# Patient Record
Sex: Female | Born: 1965
Health system: Southern US, Community
[De-identification: ages and names within clinical notes are randomized; demographics above are authoritative.]

## PROBLEM LIST (undated history)

## (undated) DIAGNOSIS — K449 Diaphragmatic hernia without obstruction or gangrene: Secondary | ICD-10-CM

## (undated) HISTORY — PX: TONSILLECTOMY: SUR1361

## (undated) HISTORY — DX: Diaphragmatic hernia without obstruction or gangrene: K44.9

## (undated) HISTORY — PX: LUMBAR DISC SURGERY: SHX700

## (undated) HISTORY — PX: REFRACTIVE SURGERY: SHX103

## (undated) HISTORY — PX: TUBAL LIGATION: SHX77

---

## 2002-09-22 ENCOUNTER — Ambulatory Visit (HOSPITAL_COMMUNITY): Admission: RE | Admit: 2002-09-22 | Discharge: 2002-09-22 | Payer: Self-pay | Admitting: Internal Medicine

## 2002-09-22 ENCOUNTER — Encounter: Payer: Self-pay | Admitting: Internal Medicine

## 2003-03-11 ENCOUNTER — Ambulatory Visit (HOSPITAL_COMMUNITY): Admission: RE | Admit: 2003-03-11 | Discharge: 2003-03-11 | Payer: Self-pay | Admitting: Internal Medicine

## 2004-08-01 ENCOUNTER — Ambulatory Visit (HOSPITAL_COMMUNITY): Admission: RE | Admit: 2004-08-01 | Discharge: 2004-08-01 | Payer: Self-pay | Admitting: Obstetrics and Gynecology

## 2004-08-07 ENCOUNTER — Other Ambulatory Visit: Admission: RE | Admit: 2004-08-07 | Discharge: 2004-08-07 | Payer: Self-pay | Admitting: Obstetrics and Gynecology

## 2004-08-21 ENCOUNTER — Inpatient Hospital Stay (HOSPITAL_COMMUNITY): Admission: RE | Admit: 2004-08-21 | Discharge: 2004-08-23 | Payer: Self-pay | Admitting: Obstetrics and Gynecology

## 2004-08-21 ENCOUNTER — Encounter: Payer: Self-pay | Admitting: Obstetrics and Gynecology

## 2004-11-04 ENCOUNTER — Emergency Department (HOSPITAL_COMMUNITY): Admission: EM | Admit: 2004-11-04 | Discharge: 2004-11-04 | Payer: Self-pay | Admitting: Emergency Medicine

## 2006-03-13 ENCOUNTER — Ambulatory Visit (HOSPITAL_COMMUNITY): Admission: RE | Admit: 2006-03-13 | Discharge: 2006-03-13 | Payer: Self-pay | Admitting: Family Medicine

## 2006-12-12 ENCOUNTER — Ambulatory Visit (HOSPITAL_COMMUNITY): Admission: RE | Admit: 2006-12-12 | Discharge: 2006-12-12 | Payer: Self-pay | Admitting: Internal Medicine

## 2009-04-06 ENCOUNTER — Ambulatory Visit (HOSPITAL_COMMUNITY): Admission: RE | Admit: 2009-04-06 | Discharge: 2009-04-06 | Payer: Self-pay | Admitting: Internal Medicine

## 2009-04-14 ENCOUNTER — Ambulatory Visit (HOSPITAL_COMMUNITY): Admission: RE | Admit: 2009-04-14 | Discharge: 2009-04-14 | Payer: Self-pay | Admitting: Internal Medicine

## 2009-05-03 ENCOUNTER — Encounter: Admission: RE | Admit: 2009-05-03 | Discharge: 2009-05-03 | Payer: Self-pay | Admitting: Endocrinology

## 2009-05-03 ENCOUNTER — Other Ambulatory Visit: Admission: RE | Admit: 2009-05-03 | Discharge: 2009-05-03 | Payer: Self-pay | Admitting: Diagnostic Radiology

## 2009-05-19 ENCOUNTER — Emergency Department (HOSPITAL_COMMUNITY): Admission: EM | Admit: 2009-05-19 | Discharge: 2009-05-19 | Payer: Self-pay | Admitting: Emergency Medicine

## 2009-05-20 ENCOUNTER — Encounter (INDEPENDENT_AMBULATORY_CARE_PROVIDER_SITE_OTHER): Payer: Self-pay | Admitting: Neurosurgery

## 2009-05-20 ENCOUNTER — Ambulatory Visit: Payer: Self-pay | Admitting: Vascular Surgery

## 2009-05-20 ENCOUNTER — Ambulatory Visit: Admission: RE | Admit: 2009-05-20 | Discharge: 2009-05-20 | Payer: Self-pay | Admitting: Neurosurgery

## 2010-02-12 HISTORY — PX: ABDOMINAL HYSTERECTOMY: SHX81

## 2010-03-04 ENCOUNTER — Encounter: Payer: Self-pay | Admitting: Endocrinology

## 2010-05-03 LAB — DIFFERENTIAL
Basophils Absolute: 0.1 10*3/uL (ref 0.0–0.1)
Basophils Relative: 1 % (ref 0–1)
Metamyelocytes Relative: 0 %
Myelocytes: 0 %
Neutro Abs: 2.3 10*3/uL (ref 1.7–7.7)
Neutrophils Relative %: 36 % — ABNORMAL LOW (ref 43–77)
Promyelocytes Absolute: 0 %

## 2010-05-03 LAB — BASIC METABOLIC PANEL
BUN: 10 mg/dL (ref 6–23)
CO2: 29 mEq/L (ref 19–32)
Chloride: 106 mEq/L (ref 96–112)
Creatinine, Ser: 0.81 mg/dL (ref 0.4–1.2)
GFR calc Af Amer: >60 mL/min (ref >60)
Potassium: 3.6 mEq/L (ref 3.5–5.1)

## 2010-05-03 LAB — D-DIMER, QUANTITATIVE: D-Dimer, Quant: 0.92 ug/mL-FEU — ABNORMAL HIGH (ref 0.00–0.48)

## 2010-05-03 LAB — POCT CARDIAC MARKERS
CKMB, poc: <1 ng/mL — ABNORMAL LOW (ref 1.0–8.0)
Myoglobin, poc: 66.8 ng/mL (ref 12–200)

## 2010-05-03 LAB — CBC
HCT: 39.5 % (ref 36.0–46.0)
MCHC: 35.1 g/dL (ref 30.0–36.0)
MCV: 88.4 fL (ref 78.0–100.0)
RBC: 4.47 MIL/uL (ref 3.87–5.11)

## 2010-06-21 ENCOUNTER — Other Ambulatory Visit: Payer: Self-pay | Admitting: Endocrinology

## 2010-06-21 DIAGNOSIS — E049 Nontoxic goiter, unspecified: Secondary | ICD-10-CM

## 2010-06-27 ENCOUNTER — Other Ambulatory Visit: Payer: Self-pay

## 2010-06-28 ENCOUNTER — Ambulatory Visit
Admission: RE | Admit: 2010-06-28 | Discharge: 2010-06-28 | Disposition: A | Discharge status: Home / Self Care | Payer: Commercial Indemnity | Source: Ambulatory Visit | Attending: Endocrinology | Admitting: Endocrinology

## 2010-06-28 DIAGNOSIS — E049 Nontoxic goiter, unspecified: Secondary | ICD-10-CM

## 2010-06-30 NOTE — Discharge Summary (Signed)
NAME:  Rachael Goodman, Rachael Goodman               ACCOUNT NO.:  [id number]   MEDICAL RECORD NO.:  [id number]          PATIENT TYPE:  INP   LOCATION:  A420                          FACILITY:  [facility]   PHYSICIAN:  Tilda Burrow, M.D. DATE OF BIRTH:  12-25-1965   DATE OF ADMISSION:  08/21/2004  DATE OF DISCHARGE:  LH                                 DISCHARGE SUMMARY   ADMISSION DIAGNOSIS:  Menometrorrhagia (heavy and prolonged periods).   DISCHARGE DIAGNOSES:  Menometrorrhagia (heavy and prolonged periods).   PROCEDURE:  Total abdominal hysterectomy with panniculectomy on August 21, 2004.   DISCHARGE MEDICATIONS:  1.  Tylox 1 q.4h. p.r.n. pain, dispense 30.  2.  Benadryl 50 mg 1 p.o. q.6h. p.r.n. itching.  3.  Doxycycline 100 mg twice daily for 7 days.   FOLLOW UP:  In five days for staple removal and JP drain removal.   HOSPITAL SUMMARY:  This 45 year old was admitted for hysterectomy as  described in the HPI. Panniculectomy was performed as well. The patient had  postoperative hemoglobin 12.4, hematocrit 36 compared to admitting diagnosis  of hemoglobin 12.7 and hematocrit 36.4. She did excellently with minimal  drainage per JP drains, approximately 30 cc per day. She will be followed  up. Followup will be in five days for JP removal and staple reduction.       JVF/MEDQ  D:  08/23/2004  T:  08/23/2004  Job:  [id number]

## 2010-06-30 NOTE — Op Note (Signed)
NAME:  Rachael Goodman, Rachael Goodman               ACCOUNT NO.:  [id number]   MEDICAL RECORD NO.:  [id number]          PATIENT TYPE:  AMB   LOCATION:  DAY                           FACILITY:  [facility]   PHYSICIAN:  Tilda Burrow, M.D. DATE OF BIRTH:  Aug 23, 1965   DATE OF PROCEDURE:  08/21/2004  DATE OF DISCHARGE:                                 OPERATIVE REPORT   PREOPERATIVE DIAGNOSIS:  Menometrorrhagia, multiple small uterine fibroids.   POSTOPERATIVE DIAGNOSIS:  Menometrorrhagia (heavy and prolonged bleeding).   OPERATION PERFORMED:  Total abdominal hysterectomy, panniculectomy.   SURGEON:  Tilda Burrow, M.D.   ASSISTANTMarlinda Mike, RN.   ANESTHESIA:  General.   COMPLICATIONS:  None.   FINDINGS:  Normal external surface to upper limits of normal sized uterus,  small thinned out area of surface of ascending colon oversewn with 2-0 silk.  Normal-appearing ovaries, no evidence of endometriosis.  Evidence of prior  Falope ring application sterilization.   DESCRIPTION OF PROCEDURE:  The patient was taken to the operating room and  prepped and draped for lower abdominal surgery with Foley catheter in place  and vaginal prepping having been performed as well as abdominal prepping.  The patient had been marked in the preop area with panniculectomy outlined,  60 cm in transverse length by approximately 10 to 14 cm in AP height.  The  patient then had sharp marking of the skin along the previously delineated  skin lines and Bovie cautery used to excise a 1.5 to 2 cm thick x 60 cm long  x 10 cm wide ellipse of skin and underlying fatty tissues, with careful  attention to point cautery of the blood vessels encountered.  The procedure  was completed with nice layer of fatty tissue left over the underlying  fascia.  The inferior and superior edges to the incision were undermined a  couple of centimeters on either side which allowed increased mobility and  ease of tissue reapproximation.  We then  closed the two sides to the  anterior superior iliac crest, with a series of 2-0 plain sutures on either  side pulling the tissue together and then a series of interrupted horizontal  mattress sutures just beneath the skin, which resulted in good tissue edge  approximation.  Staples were closed from the anterior superior iliac crest  to the lateral aspect of the incision.   The middle portion of the incision was then opened in the method of  Pfannenstiel with midline entry into the peritoneal cavity without  difficulty and identification of normal-appearing bowel, well evacuated by  preop bowel prep.  The bowel was packed away using three laparotomy tapes  that were moistened and then Balfour retractor was positioned.  The uterus  was grasped with a Lahey thyroid tenaculum, elevated, round ligaments cut,  tied and ligated bilaterally using Kelly clamps, scissor transection and 0  chromic ligature.  The utero-ovarian ligaments were isolated on either side,  clamped, cut and suture ligated.  Bladder flap was developed anteriorly.  The uterine vessels were skeletonized, cross-clamped with curved Heaney  clamp with Kelly clamp placed  above the area  of the intended transection  and then the uterine vessels were transected.  The upper and lower cardinal  ligaments were serially clamped, cut and suture ligated using straight  Heaney clamps, knife dissection and 0 chromic suture ligature. The bladder  flap was down, had been down nicely and an anterior stab incision in the  anterior cervical vaginal fornix was performed and the really large 4 cm  wide cervix excised off of the vaginal cuff.  Four Aldridge stitches were  placed for lateral support.  Aldridge stitches were placed at each lateral  vaginal angle for good hemostasis.  A figure-of-eight suture was placed in  the posterior vaginal apex to reduce the diameter of the posterior vaginal  cuff.  We then closed the cuff side-to-side with a  series of interrupted 0  plain sutures without any difficulty.  Hemostasis was satisfactory.  We did  require point cautery a couple of times along the anterior vaginal wall.  We  were careful to stay away from the bladder sufficiently to feel confident  that no potential bladder injury occurred.  The procedure was then  continued by inspection for hemostasis, reapproximation of the bladder flap,  inspection of the pedicles _of the utero-ovarian ligament and confirming  hemostasis as needed.  Laparotomy equipment was removed.  Anterior  peritoneum closed with 2-0 chromic.  The fascia trimmed in the midline  approximately 1.5 cm which allowed pulling the fascia together with improved  tone and then we reapproximated the subcu fatty tissue with 2-0 plain,  pulled the skin edges together as well and used staples to complete tissue  edge reapproximation.  Flap Jackson-Pratt drains had been placed in the  subcutaneous fatty space prior to placement of the skin closure.  These were  allowed to exit through separate stab incisions.  Sponge and needle counts  were correct.  The patient was then transferred to the recovery room in good  condition.       JVF/MEDQ  D:  08/21/2004  T:  08/21/2004  Job:  [id number]

## 2010-06-30 NOTE — H&P (Signed)
NAME:  Rachael Goodman, Rachael Goodman               ACCOUNT NO.:  [id number]   MEDICAL RECORD NO.:  [id number]          PATIENT TYPE:  AMB   LOCATION:  DAY                           FACILITY:  [facility]   PHYSICIAN:  Tilda Burrow, M.D. DATE OF BIRTH:  1965-11-08   DATE OF ADMISSION:  DATE OF DISCHARGE:  LH                                HISTORY & PHYSICAL   ADMISSION DIAGNOSIS:  Menometrorrhagia, heavy and prolonged periods.   HISTORY OF PRESENT ILLNESS:  This 45 year old female was admitted for a  hysterectomy for heavy and prolonged periods, described as up to 17 days in  length, 3 days light, 7 days heavy, 7 days light, which were not resulting  in distinct anemia.  She had an endometrial biopsy which showed  dyssynchronous endometrium without inflammation or easily treated options.  Uterus is slightly enlarged, measuring 9 cm along the endometrial biopsy.  The uterus had  several uterine fibroids, all small in size.  There was a  suspected endometrial polyp in the lower uterine segment.  This was not  identified on biopsy effort.  The patient is a smoker and not interested in  oral contraceptive management and desires to proceed with a hysterectomy.  GC and chlamydia cultures are negative.  Pap smear is reported as normal.  Additionally the patient desires partial panniculectomy for cosmetic  reasons.  This has been sketched out with the patient, using paint markers,  with the patient allowed to evaluate this, and she desires to have this  performed as a portion of the procedure, and the patient is aware that this  is a noncovered service.   PAST MEDICAL HISTORY:  Benign.   PAST SURGICAL HISTORY:  Tonsillectomy in 1970, tubal ligation in 2002.   PHYSICAL EXAMINATION:  VITAL SIGNS:  Height 5 feet 6 inches, weight 160.  Blood pressure 116/76.  GENERAL:  Exam shows a healthy-appearing, energetic, active African-American  female who is alert and oriented times three.  Last menstrual period August 14, 2004.  HEENT:  Pupils are equal, round and reactive.  Extraocular movements are  intact.  NECK:  Supple.  CHEST:  Clear to auscultation.  ABDOMEN:  Actually quite good tone for the most part, though the patient  finds the lower abdominal wall skin laxity aggravating.  The patient has  been told that some swelling is part of the recovery process and that the  increased incision size increases the risk of infection or bleeding from the  incision itself.  The patient seems to have a realistic understanding of the  limitations of tummy tuck surgery, particularly in someone who is not  morbidly obese.   GYN HISTORY:  Notable for an ultrasound showing uterus to be 8.8 x 4.3 x 5.8  cm with GC and chlamydia cultures negative.  OB history notable for trisomy  41 with her last pregnancy and cystic hygroma noted on the infant.  Periods  are described as lasting greater than 3 weeks for several menses in a row.   PLAN:  Hysterectomy, preservation of ovaries, removal of cervix,  panniculectomy 08/21/2004 at 9  a.m.       JVF/MEDQ  D:  08/18/2004  T:  08/18/2004  Job:  [id number]   cc:   Tilda Burrow, M.D.  Fax: [phone]

## 2010-10-12 ENCOUNTER — Other Ambulatory Visit (HOSPITAL_COMMUNITY): Payer: Self-pay | Admitting: Family Medicine

## 2010-10-12 DIAGNOSIS — Z139 Encounter for screening, unspecified: Secondary | ICD-10-CM

## 2010-10-19 ENCOUNTER — Ambulatory Visit (HOSPITAL_COMMUNITY): Admission: RE | Admit: 2010-10-19 | Payer: Managed Care, Other (non HMO) | Source: Ambulatory Visit

## 2011-09-04 ENCOUNTER — Other Ambulatory Visit (HOSPITAL_COMMUNITY): Payer: Self-pay | Admitting: Internal Medicine

## 2011-09-04 DIAGNOSIS — R1314 Dysphagia, pharyngoesophageal phase: Secondary | ICD-10-CM

## 2011-09-04 DIAGNOSIS — Z0000 Encounter for general adult medical examination without abnormal findings: Secondary | ICD-10-CM

## 2011-09-11 ENCOUNTER — Ambulatory Visit (HOSPITAL_COMMUNITY)
Admission: RE | Admit: 2011-09-11 | Discharge: 2011-09-11 | Disposition: A | Discharge status: Home / Self Care | Payer: Managed Care, Other (non HMO) | Source: Ambulatory Visit | Attending: Internal Medicine | Admitting: Internal Medicine

## 2011-09-11 ENCOUNTER — Ambulatory Visit (HOSPITAL_COMMUNITY): Payer: Managed Care, Other (non HMO)

## 2011-09-11 DIAGNOSIS — K224 Dyskinesia of esophagus: Secondary | ICD-10-CM | POA: Insufficient documentation

## 2011-09-11 DIAGNOSIS — Z1231 Encounter for screening mammogram for malignant neoplasm of breast: Secondary | ICD-10-CM | POA: Insufficient documentation

## 2011-09-11 DIAGNOSIS — Z0000 Encounter for general adult medical examination without abnormal findings: Secondary | ICD-10-CM

## 2011-09-11 DIAGNOSIS — R1314 Dysphagia, pharyngoesophageal phase: Secondary | ICD-10-CM

## 2011-09-13 ENCOUNTER — Other Ambulatory Visit (HOSPITAL_COMMUNITY): Payer: Self-pay | Admitting: Internal Medicine

## 2011-09-13 DIAGNOSIS — R51 Headache: Secondary | ICD-10-CM

## 2011-09-17 ENCOUNTER — Ambulatory Visit (HOSPITAL_COMMUNITY)
Admission: RE | Admit: 2011-09-17 | Discharge: 2011-09-17 | Disposition: A | Discharge status: Home / Self Care | Payer: Managed Care, Other (non HMO) | Source: Ambulatory Visit | Attending: Internal Medicine | Admitting: Internal Medicine

## 2011-09-17 ENCOUNTER — Encounter (HOSPITAL_COMMUNITY): Payer: Self-pay

## 2011-09-17 DIAGNOSIS — R51 Headache: Secondary | ICD-10-CM | POA: Insufficient documentation

## 2011-09-20 ENCOUNTER — Encounter (INDEPENDENT_AMBULATORY_CARE_PROVIDER_SITE_OTHER): Payer: Self-pay | Admitting: *Deleted

## 2011-09-27 ENCOUNTER — Telehealth (INDEPENDENT_AMBULATORY_CARE_PROVIDER_SITE_OTHER): Payer: Self-pay | Admitting: *Deleted

## 2011-09-27 ENCOUNTER — Ambulatory Visit (INDEPENDENT_AMBULATORY_CARE_PROVIDER_SITE_OTHER): Payer: Managed Care, Other (non HMO) | Admitting: Internal Medicine

## 2011-09-27 ENCOUNTER — Encounter (INDEPENDENT_AMBULATORY_CARE_PROVIDER_SITE_OTHER): Payer: Self-pay | Admitting: Internal Medicine

## 2011-09-27 ENCOUNTER — Other Ambulatory Visit (INDEPENDENT_AMBULATORY_CARE_PROVIDER_SITE_OTHER): Payer: Self-pay | Admitting: *Deleted

## 2011-09-27 VITALS — BP 104/58 | HR 72 | Temp 98.6°F | Ht 66.0 in | Wt 171.8 lb

## 2011-09-27 DIAGNOSIS — R1310 Dysphagia, unspecified: Secondary | ICD-10-CM

## 2011-09-27 DIAGNOSIS — R198 Other specified symptoms and signs involving the digestive system and abdomen: Secondary | ICD-10-CM | POA: Insufficient documentation

## 2011-09-27 DIAGNOSIS — R1314 Dysphagia, pharyngoesophageal phase: Secondary | ICD-10-CM

## 2011-09-27 DIAGNOSIS — R194 Change in bowel habit: Secondary | ICD-10-CM

## 2011-09-27 DIAGNOSIS — Z1211 Encounter for screening for malignant neoplasm of colon: Secondary | ICD-10-CM

## 2011-09-27 DIAGNOSIS — K582 Mixed irritable bowel syndrome: Secondary | ICD-10-CM

## 2011-09-27 DIAGNOSIS — K589 Irritable bowel syndrome without diarrhea: Secondary | ICD-10-CM

## 2011-09-27 MED ORDER — OMEPRAZOLE 40 MG PO CPDR: 40.0000 mg | DELAYED_RELEASE_CAPSULE | Freq: Every day | ORAL | Status: DC

## 2011-09-27 NOTE — Telephone Encounter (Signed)
Patient needs movi prep 

## 2011-09-27 NOTE — Progress Notes (Signed)
Subjective:     Patient ID: Rachael Goodman, female   DOB: [birth year], 46 y.o.   MRN: [id number]  HPI Referred to our office by Dr. Sherwood Gambler. She tells me she is having problems swallowing. Symptoms for over a year. Beef burger and thicker foods will lodge.   She underwent a Barium Swallow in July: IMPRESSION:  1. Moderate esophageal dysmotility, nonspecific but most likely  age advanced presbyesophagus.  2. No stricture or anatomic cause for patient's symptoms.    She says it is hard for her to pass her stool even it is diarrhea or constipation.  Symptoms x 2 yrs.  She says no matter how much she strains stool will not come out.  No melena or bright red rectal bleeding. Stools are brown in color.  Stools are normal size but some are pencil thin.  With constipation, stools are ball like.    Review of Systems see hpi Current Outpatient Prescriptions  Medication Sig Dispense Refill  . amphetamine-dextroamphetamine (ADDERALL XR) 20 MG 24 hr capsule Take 20 mg by mouth every morning.       History reviewed. No pertinent past medical history. Past Surgical History  Procedure Date  . Abdominal hysterectomy    No Known Allergies History   Social History  . Marital Status: Married    Spouse Name: N/A    Number of Children: N/A  . Years of Education: N/A   Occupational History  . Not on file.   Social History Main Topics  . Smoking status: Current Everyday Smoker  . Smokeless tobacco: Not on file   Comment: 1 pack every two days  . Alcohol Use: Yes     glass of wine rarely  . Drug Use: No  . Sexually Active: Not on file   Other Topics Concern  . Not on file   Social History Narrative  . No narrative on file   Family Status  Relation Status Death Age  . Mother Alive     DM 2,  . Father Deceased     CHF  . Sister Alive   . Brother Alive     hypertension        Objective:   Physical Exam  Filed Vitals:   09/27/11 1019  Height: 5\' 6"  (1.676 m)  Weight: 171 lb 12.8  oz (77.928 kg)   Alert and oriented. Skin warm and dry. Oral mucosa is moist.   . Sclera anicteric, conjunctivae is pink. Thyroid not enlarged. No cervical lymphadenopathy. Lungs clear. Heart regular rate and rhythm.  Abdomen is soft. Bowel sounds are positive. No hepatomegaly. No abdominal masses felt. No tenderness.  No edema to lower extremities.        Assessment:   Dysphagia to solids. Change in stools. Constipation/Diarrhea.    Plan:    EGD/Ed/ Colonoscopy.  The risks and benefits such as perforation, bleeding, and infection were reviewed with the patient and is agreeable.  Samples of Linzess x 2 box given to patient for her to try with instructions.

## 2011-09-27 NOTE — Patient Instructions (Addendum)
EGD for dysphagia. Colonnoscopy for change in stools.The risks and benefits such as perforation, bleeding, and infection were reviewed with the patient and is agreeable. Rx for Omeprazole eprescribed to [facility].

## 2011-09-28 MED ORDER — PEG-KCL-NACL-NASULF-NA ASC-C 100 G PO SOLR: 1.0000 | Freq: Once | ORAL | Status: DC

## 2011-10-08 ENCOUNTER — Encounter (HOSPITAL_COMMUNITY): Payer: Self-pay | Admitting: Pharmacy Technician

## 2011-10-24 MED ORDER — SODIUM CHLORIDE 0.45 % IV SOLN
INTRAVENOUS | Status: DC
  Administered 2011-10-25: 12:00:00 via INTRAVENOUS

## 2011-10-25 ENCOUNTER — Ambulatory Visit (HOSPITAL_COMMUNITY)
Admission: RE | Admit: 2011-10-25 | Discharge: 2011-10-25 | Disposition: A | Discharge status: Home / Self Care | Payer: Managed Care, Other (non HMO) | Source: Ambulatory Visit | Attending: Internal Medicine | Admitting: Internal Medicine

## 2011-10-25 ENCOUNTER — Encounter (HOSPITAL_COMMUNITY): Payer: Self-pay | Admitting: *Deleted

## 2011-10-25 ENCOUNTER — Encounter (HOSPITAL_COMMUNITY)
Admission: RE | Disposition: A | Discharge status: Home / Self Care | Payer: Self-pay | Source: Ambulatory Visit | Attending: Internal Medicine

## 2011-10-25 DIAGNOSIS — K5730 Diverticulosis of large intestine without perforation or abscess without bleeding: Secondary | ICD-10-CM | POA: Insufficient documentation

## 2011-10-25 DIAGNOSIS — R194 Change in bowel habit: Secondary | ICD-10-CM

## 2011-10-25 DIAGNOSIS — K5900 Constipation, unspecified: Secondary | ICD-10-CM

## 2011-10-25 DIAGNOSIS — R1310 Dysphagia, unspecified: Secondary | ICD-10-CM

## 2011-10-25 DIAGNOSIS — K5909 Other constipation: Secondary | ICD-10-CM | POA: Insufficient documentation

## 2011-10-25 DIAGNOSIS — D126 Benign neoplasm of colon, unspecified: Secondary | ICD-10-CM | POA: Insufficient documentation

## 2011-10-25 DIAGNOSIS — K449 Diaphragmatic hernia without obstruction or gangrene: Secondary | ICD-10-CM

## 2011-10-25 DIAGNOSIS — K208 Other esophagitis: Secondary | ICD-10-CM

## 2011-10-25 HISTORY — PX: SAVORY DILATION: SHX5439

## 2011-10-25 HISTORY — PX: MALONEY DILATION: SHX5535

## 2011-10-25 HISTORY — PX: BALLOON DILATION: SHX5330

## 2011-10-25 SURGERY — COLONOSCOPY WITH ESOPHAGOGASTRODUODENOSCOPY (EGD)
Anesthesia: Moderate Sedation

## 2011-10-25 MED ORDER — MIDAZOLAM HCL 5 MG/5ML IJ SOLN
INTRAMUSCULAR | Status: DC | PRN
  Administered 2011-10-25 (×7): 2 mg via INTRAVENOUS

## 2011-10-25 MED ORDER — MEPERIDINE HCL 50 MG/ML IJ SOLN
INTRAMUSCULAR | Status: DC | PRN
  Administered 2011-10-25 (×2): 25 mg via INTRAVENOUS

## 2011-10-25 MED ORDER — MEPERIDINE HCL 50 MG/ML IJ SOLN
INTRAMUSCULAR | Status: AC
  Filled 2011-10-25: qty 1

## 2011-10-25 MED ORDER — MIDAZOLAM HCL 5 MG/5ML IJ SOLN
INTRAMUSCULAR | Status: AC
  Filled 2011-10-25: qty 10

## 2011-10-25 MED ORDER — STERILE WATER FOR IRRIGATION IR SOLN
Status: DC | PRN
  Administered 2011-10-25: 13:00:00

## 2011-10-25 MED ORDER — BUTAMBEN-TETRACAINE-BENZOCAINE 2-2-14 % EX AERO
INHALATION_SPRAY | CUTANEOUS | Status: DC | PRN
  Administered 2011-10-25: 2 via TOPICAL

## 2011-10-25 NOTE — OR Nursing (Signed)
14 mg of versed given during egd and colonoscopy procedure and 1 mg of versed wasted and witnessed by d houghton

## 2011-10-25 NOTE — Op Note (Signed)
EGD PROCEDURE REPORT  PATIENT:  Rachael Goodman  MR#:  [id number] Birthdate:  [birth year], 46 y.o., female Endoscopist:  Dr. Malissa Hippo, MD Referred By:  Dr. Madelin Rear. Sherwood Gambler, MD Procedure Date: 10/25/2011  Procedure:   EGD, ED & Colonoscopy.  Indications:  Patient is 46 year old African female who presents with recurrent solid food dysphagia. She's had this symptom for well over a year. She has chronic GERD. She is on omeprazole but does not take it every day. She also is noted constipation and undergoing colonoscopy as well.            Informed Consent:  The risks, benefits, alternatives & imponderables which include, but are not limited to, bleeding, infection, perforation, drug reaction and potential missed lesion have been reviewed.  The potential for biopsy, lesion removal, esophageal dilation, etc. have also been discussed.  Questions have been answered.  All parties agreeable.  Please see history & physical in medical record for more information.  Medications:  Demerol 50 mg IV Versed 14 mg IV Cetacaine spray topically for oropharyngeal anesthesia  EGD  Description of procedure:  The endoscope was introduced through the mouth and advanced to the second portion of the duodenum without difficulty or limitations. The mucosal surfaces were surveyed very carefully during advancement of the scope and upon withdrawal.  Findings:  Esophagus:  Mucosa of the esophagus was normal. Focal erythema noted at GE junction no ring or stricture was identified. GEJ:  36 cm Hiatus:  38 cm Stomach:  Stomach was empty and distended very well with insufflation. Folds in the proximal stomach were normal. At gastric body along the posterior wall there was circumfle right lesion with appearance suggestive of AV malformation.mucosae at antrum pyloric channel angularis fundus and cardia was normal.  Duodenum:  Normal bulbar mucosa. Cozaar of second part of the duodenum was normal except single  lymphangiectasia.  Therapeutic/Diagnostic Maneuvers Performed:  Esophagus dilated by passing 54 and 56 [place] Maloney dilator. Esophageal mucosa was reexamined post dilation and no mucosal disruption identified.  COLONOSCOPY Description of procedure:  After a digital rectal exam was performed, that colonoscope was advanced from the anus through the rectum and colon to the area of the cecum, ileocecal valve and appendiceal orifice. The cecum was deeply intubated. These structures were well-seen and photographed for the record. From the level of the cecum and ileocecal valve, the scope was slowly and cautiously withdrawn. The mucosal surfaces were carefully surveyed utilizing scope tip to flexion to facilitate fold flattening as needed. The scope was pulled down into the rectum where a thorough exam including retroflexion was performed.  Findings:   Prep excellent. Small diverticulum next to ileocecal valve. Two small polyps ablated via cold biopsy from the region of splenic flexure and submitted together. Normal rectal mucosa and anal rectal junction.  Therapeutic/Diagnostic Maneuvers Performed:  See above  Complications:  None  Cecal Withdrawal Time:  9 minutes  Impression:  Mild changes of reflux esophagitis limited to GE junction. Small sliding hiatal hernia. Single small AV malformation at gastric body. Esophagus dilated by passing 54 and 56 [place] Maloney dilators given history of dysphagia. Single small cecal diverticulum. Two small polyps ablated via cold biopsy from splenic flexure and submitted together.   Recommendations:  Antireflux measures reinforced. Patient advised to take omeprazole daily. High fiber diet plus fiber supplement 3-4 g daily. Colace 2 tablets by mouth daily. Office visit with Dr. Emelda Fear to check for rectocele. I will contact patient with results of biopsy  and further recommendations.  Marzell Allemand U  10/25/2011 2:40 PM  CC: Dr. Cassell Smiles.,  MD & Dr. Bonnetta Barry ref. provider found

## 2011-10-25 NOTE — H&P (Signed)
Rachael Goodman is an 46 y.o. female.   Chief Complaint: Patient is here for EGD, ED and colonoscopy. HPI: Patient is 46 year old African female who presents with several month history of solid food dysphagia. She also has difficulty with ice cream. She has sporadic heartburn. She believes PPIs helping. She also complains of constipation denies abdominal pain melena or rectal bleeding. At times she has to push in perineal region in order to have a bowel movement. Her last pelvic exam was 3 years ago. Patient had her esophagus stretched 7 years ago and it alleviated her dysphagia for several years. Him history is negative for colorectal carcinoma.  History reviewed. No pertinent past medical history.  Past Surgical History  Procedure Date  . Abdominal hysterectomy     History reviewed. No pertinent family history. Social History:  reports that she has been smoking.  She does not have any smokeless tobacco history on file. She reports that she drinks alcohol. She reports that she does not use illicit drugs.  Allergies: No Known Allergies  Medications Prior to Admission  Medication Sig Dispense Refill  . amphetamine-dextroamphetamine (ADDERALL XR) 20 MG 24 hr capsule Take 20 mg by mouth every morning.      . diazepam (VALIUM) 2 MG tablet Take 2 mg by mouth every 6 (six) hours as needed. Sleep      . hyoscyamine (LEVSIN SL) 0.125 MG SL tablet Place 0.125 mg under the tongue every 4 (four) hours as needed. Stomach Cramps      . omeprazole (PRILOSEC) 40 MG capsule Take 40 mg by mouth daily.       . peg 3350 powder (MOVIPREP) 100 G SOLR Take 1 kit (100 g total) by mouth once.  1 kit  0  . triamcinolone cream (KENALOG) 0.1 % Apply 1 application topically 2 (two) times daily as needed. Itching/Dry Skin        No results found for this or any previous visit (from the past 48 hour(s)). No results found.  ROS  Blood pressure 113/73, pulse 74, temperature 98 F (36.7 C), temperature source Oral,  resp. rate 20, height 5\' 6"  (1.676 m), weight 168 lb (76.204 kg), SpO2 98.00%. Physical Exam  Constitutional: She appears well-developed and well-nourished.  HENT:  Mouth/Throat: Oropharynx is clear and moist.  Eyes: Conjunctivae normal are normal. No scleral icterus.  Neck: No thyromegaly present.  Cardiovascular: Normal rate, regular rhythm and normal heart sounds.   No murmur heard. Respiratory: Effort normal and breath sounds normal.  GI: Soft. She exhibits no distension and no mass. There is no tenderness.  Musculoskeletal: She exhibits no edema.  Lymphadenopathy:    She has no cervical adenopathy.  Neurological: She is alert.  Skin: Skin is warm and dry.     Assessment/Plan Food dysphagia in a patient with chronic GERD. Change in bowel habits with constipation. She may also have rectocele. EGD, ED and colonoscopy.  Rachael Goodman U 10/25/2011, 1:33 PM

## 2011-10-29 ENCOUNTER — Encounter (HOSPITAL_COMMUNITY): Payer: Self-pay | Admitting: Internal Medicine

## 2011-11-06 ENCOUNTER — Encounter (INDEPENDENT_AMBULATORY_CARE_PROVIDER_SITE_OTHER): Payer: Self-pay | Admitting: *Deleted

## 2012-06-17 ENCOUNTER — Other Ambulatory Visit (HOSPITAL_COMMUNITY): Payer: Self-pay | Admitting: Internal Medicine

## 2012-06-17 ENCOUNTER — Ambulatory Visit (HOSPITAL_COMMUNITY)
Admission: RE | Admit: 2012-06-17 | Discharge: 2012-06-17 | Disposition: A | Discharge status: Home / Self Care | Payer: 59 | Source: Ambulatory Visit | Attending: Internal Medicine | Admitting: Internal Medicine

## 2012-06-17 DIAGNOSIS — S59909A Unspecified injury of unspecified elbow, initial encounter: Secondary | ICD-10-CM | POA: Insufficient documentation

## 2012-06-17 DIAGNOSIS — S6990XA Unspecified injury of unspecified wrist, hand and finger(s), initial encounter: Secondary | ICD-10-CM | POA: Insufficient documentation

## 2012-06-17 DIAGNOSIS — S63509A Unspecified sprain of unspecified wrist, initial encounter: Secondary | ICD-10-CM

## 2012-06-17 DIAGNOSIS — W19XXXA Unspecified fall, initial encounter: Secondary | ICD-10-CM | POA: Insufficient documentation

## 2012-06-17 DIAGNOSIS — IMO0002 Reserved for concepts with insufficient information to code with codable children: Secondary | ICD-10-CM

## 2012-06-17 DIAGNOSIS — M25529 Pain in unspecified elbow: Secondary | ICD-10-CM | POA: Insufficient documentation

## 2012-06-17 DIAGNOSIS — M25539 Pain in unspecified wrist: Secondary | ICD-10-CM | POA: Insufficient documentation

## 2013-05-04 ENCOUNTER — Other Ambulatory Visit (HOSPITAL_COMMUNITY): Payer: Self-pay | Admitting: Internal Medicine

## 2013-05-04 DIAGNOSIS — Z0000 Encounter for general adult medical examination without abnormal findings: Secondary | ICD-10-CM

## 2013-05-04 DIAGNOSIS — R1310 Dysphagia, unspecified: Secondary | ICD-10-CM

## 2013-05-04 DIAGNOSIS — E041 Nontoxic single thyroid nodule: Secondary | ICD-10-CM

## 2013-05-08 ENCOUNTER — Ambulatory Visit (HOSPITAL_COMMUNITY): Payer: 59

## 2013-05-08 ENCOUNTER — Other Ambulatory Visit (HOSPITAL_COMMUNITY): Payer: 59

## 2013-05-08 ENCOUNTER — Ambulatory Visit (HOSPITAL_COMMUNITY): Payer: 59 | Attending: Internal Medicine

## 2013-05-11 ENCOUNTER — Ambulatory Visit (HOSPITAL_COMMUNITY): Payer: 59

## 2013-05-11 ENCOUNTER — Other Ambulatory Visit (HOSPITAL_COMMUNITY): Payer: 59

## 2013-09-04 ENCOUNTER — Encounter (HOSPITAL_BASED_OUTPATIENT_CLINIC_OR_DEPARTMENT_OTHER): Payer: Self-pay | Admitting: *Deleted

## 2013-09-04 ENCOUNTER — Other Ambulatory Visit: Payer: Self-pay | Admitting: Orthopedic Surgery

## 2013-09-04 NOTE — Progress Notes (Signed)
No labs needed-nurse psych ed

## 2013-09-07 ENCOUNTER — Ambulatory Visit (HOSPITAL_BASED_OUTPATIENT_CLINIC_OR_DEPARTMENT_OTHER): Payer: 59 | Admitting: Anesthesiology

## 2013-09-07 ENCOUNTER — Ambulatory Visit (HOSPITAL_BASED_OUTPATIENT_CLINIC_OR_DEPARTMENT_OTHER)
Admission: RE | Admit: 2013-09-07 | Discharge: 2013-09-07 | Disposition: A | Discharge status: Home / Self Care | Payer: 59 | Source: Ambulatory Visit | Attending: Orthopedic Surgery | Admitting: Orthopedic Surgery

## 2013-09-07 ENCOUNTER — Encounter (HOSPITAL_BASED_OUTPATIENT_CLINIC_OR_DEPARTMENT_OTHER)
Admission: RE | Disposition: A | Discharge status: Home / Self Care | Payer: Self-pay | Source: Ambulatory Visit | Attending: Orthopedic Surgery

## 2013-09-07 ENCOUNTER — Encounter (HOSPITAL_BASED_OUTPATIENT_CLINIC_OR_DEPARTMENT_OTHER): Payer: Self-pay | Admitting: *Deleted

## 2013-09-07 ENCOUNTER — Encounter (HOSPITAL_BASED_OUTPATIENT_CLINIC_OR_DEPARTMENT_OTHER): Payer: 59 | Admitting: Anesthesiology

## 2013-09-07 DIAGNOSIS — F17200 Nicotine dependence, unspecified, uncomplicated: Secondary | ICD-10-CM | POA: Insufficient documentation

## 2013-09-07 DIAGNOSIS — S62609A Fracture of unspecified phalanx of unspecified finger, initial encounter for closed fracture: Secondary | ICD-10-CM

## 2013-09-07 DIAGNOSIS — X58XXXA Exposure to other specified factors, initial encounter: Secondary | ICD-10-CM | POA: Insufficient documentation

## 2013-09-07 DIAGNOSIS — IMO0002 Reserved for concepts with insufficient information to code with codable children: Secondary | ICD-10-CM | POA: Insufficient documentation

## 2013-09-07 HISTORY — PX: CLOSED REDUCTION FINGER WITH PERCUTANEOUS PINNING: SHX5612

## 2013-09-07 LAB — POCT HEMOGLOBIN-HEMACUE: Hemoglobin: 14.2 g/dL (ref 12.0–15.0)

## 2013-09-07 SURGERY — CLOSED REDUCTION, FINGER, WITH PERCUTANEOUS PINNING
Anesthesia: General | Laterality: Right

## 2013-09-07 MED ORDER — FENTANYL CITRATE 0.05 MG/ML IJ SOLN
INTRAMUSCULAR | Status: DC | PRN
  Administered 2013-09-07: 100 ug via INTRAVENOUS

## 2013-09-07 MED ORDER — OXYCODONE HCL 5 MG PO TABS: 5.0000 mg | ORAL_TABLET | Freq: Once | ORAL | Status: DC | PRN

## 2013-09-07 MED ORDER — HYDROMORPHONE HCL PF 1 MG/ML IJ SOLN: 0.2500 mg | INTRAMUSCULAR | Status: DC | PRN

## 2013-09-07 MED ORDER — PROPOFOL 10 MG/ML IV BOLUS
INTRAVENOUS | Status: DC | PRN
  Administered 2013-09-07: 180 mg via INTRAVENOUS

## 2013-09-07 MED ORDER — BUPIVACAINE HCL (PF) 0.25 % IJ SOLN
INTRAMUSCULAR | Status: AC
  Filled 2013-09-07: qty 30

## 2013-09-07 MED ORDER — FENTANYL CITRATE 0.05 MG/ML IJ SOLN
INTRAMUSCULAR | Status: AC
  Filled 2013-09-07: qty 6

## 2013-09-07 MED ORDER — FENTANYL CITRATE 0.05 MG/ML IJ SOLN: 50.0000 ug | INTRAMUSCULAR | Status: DC | PRN

## 2013-09-07 MED ORDER — OXYCODONE HCL 5 MG/5ML PO SOLN: 5.0000 mg | Freq: Once | ORAL | Status: DC | PRN

## 2013-09-07 MED ORDER — ONDANSETRON HCL 4 MG/2ML IJ SOLN
INTRAMUSCULAR | Status: DC | PRN
  Administered 2013-09-07: 4 mg via INTRAVENOUS

## 2013-09-07 MED ORDER — CHLORHEXIDINE GLUCONATE 4 % EX LIQD: 60.0000 mL | Freq: Once | CUTANEOUS | Status: DC

## 2013-09-07 MED ORDER — DEXAMETHASONE SODIUM PHOSPHATE 4 MG/ML IJ SOLN
INTRAMUSCULAR | Status: DC | PRN
  Administered 2013-09-07: 8 mg via INTRAVENOUS

## 2013-09-07 MED ORDER — CEFAZOLIN SODIUM-DEXTROSE 2-3 GM-% IV SOLR
INTRAVENOUS | Status: AC
  Filled 2013-09-07: qty 50

## 2013-09-07 MED ORDER — MIDAZOLAM HCL 2 MG/2ML IJ SOLN
INTRAMUSCULAR | Status: AC
  Filled 2013-09-07: qty 2

## 2013-09-07 MED ORDER — LACTATED RINGERS IV SOLN
INTRAVENOUS | Status: DC
  Administered 2013-09-07: 11:00:00 via INTRAVENOUS

## 2013-09-07 MED ORDER — LIDOCAINE HCL (CARDIAC) 20 MG/ML IV SOLN
INTRAVENOUS | Status: DC | PRN
  Administered 2013-09-07: 50 mg via INTRAVENOUS

## 2013-09-07 MED ORDER — BUPIVACAINE HCL (PF) 0.25 % IJ SOLN
INTRAMUSCULAR | Status: DC | PRN
  Administered 2013-09-07: 8 mL

## 2013-09-07 MED ORDER — MIDAZOLAM HCL 2 MG/2ML IJ SOLN: 1.0000 mg | INTRAMUSCULAR | Status: DC | PRN

## 2013-09-07 MED ORDER — CEFAZOLIN SODIUM-DEXTROSE 2-3 GM-% IV SOLR
2.0000 g | INTRAVENOUS | Status: AC
  Administered 2013-09-07: 2 g via INTRAVENOUS

## 2013-09-07 MED ORDER — MIDAZOLAM HCL 5 MG/5ML IJ SOLN
INTRAMUSCULAR | Status: DC | PRN
  Administered 2013-09-07: 2 mg via INTRAVENOUS

## 2013-09-07 MED ORDER — OXYCODONE-ACETAMINOPHEN 5-325 MG PO TABS: 1.0000 | ORAL_TABLET | ORAL | Status: DC | PRN

## 2013-09-07 SURGICAL SUPPLY — 64 items
APL SKNCLS STERI-STRIP NONHPOA (GAUZE/BANDAGES/DRESSINGS)
BANDAGE ELASTIC 3 VELCRO ST LF (GAUZE/BANDAGES/DRESSINGS) ×3 IMPLANT
BANDAGE ELASTIC 4 VELCRO ST LF (GAUZE/BANDAGES/DRESSINGS) IMPLANT
BENZOIN TINCTURE PRP APPL 2/3 (GAUZE/BANDAGES/DRESSINGS) IMPLANT
BLADE SURG 15 STRL LF DISP TIS (BLADE) ×1 IMPLANT
BLADE SURG 15 STRL SS (BLADE) ×3
BNDG CMPR 9X4 STRL LF SNTH (GAUZE/BANDAGES/DRESSINGS) ×1
BNDG CMPR MD 5X2 ELC HKLP STRL (GAUZE/BANDAGES/DRESSINGS)
BNDG COHESIVE 1X5 TAN STRL LF (GAUZE/BANDAGES/DRESSINGS) ×3 IMPLANT
BNDG ELASTIC 2 VLCR STRL LF (GAUZE/BANDAGES/DRESSINGS) IMPLANT
BNDG ESMARK 4X9 LF (GAUZE/BANDAGES/DRESSINGS) ×3 IMPLANT
BNDG GAUZE ELAST 4 BULKY (GAUZE/BANDAGES/DRESSINGS) IMPLANT
CANISTER SUCT 1200ML W/VALVE (MISCELLANEOUS) IMPLANT
CLOSURE WOUND 1/2 X4 (GAUZE/BANDAGES/DRESSINGS)
CORDS BIPOLAR (ELECTRODE) ×3 IMPLANT
COVER TABLE BACK 60X90 (DRAPES) ×3 IMPLANT
CUFF TOURNIQUET SINGLE 18IN (TOURNIQUET CUFF) IMPLANT
DECANTER SPIKE VIAL GLASS SM (MISCELLANEOUS) IMPLANT
DRAPE EXTREMITY T 121X128X90 (DRAPE) ×3 IMPLANT
DRAPE OEC MINIVIEW 54X84 (DRAPES) ×3 IMPLANT
DRAPE SURG 17X23 STRL (DRAPES) ×3 IMPLANT
DURAPREP 26ML APPLICATOR (WOUND CARE) ×3 IMPLANT
GAUZE SPONGE 4X4 12PLY STRL (GAUZE/BANDAGES/DRESSINGS) ×3 IMPLANT
GAUZE SPONGE 4X4 16PLY XRAY LF (GAUZE/BANDAGES/DRESSINGS) IMPLANT
GAUZE XEROFORM 1X8 LF (GAUZE/BANDAGES/DRESSINGS) IMPLANT
GLOVE BIO SURGEON STRL SZ 6.5 (GLOVE) ×2 IMPLANT
GLOVE BIO SURGEONS STRL SZ 6.5 (GLOVE) ×1
GLOVE BIOGEL M STRL SZ7.5 (GLOVE) ×9 IMPLANT
GLOVE SURG SYN 8.0 (GLOVE) ×6 IMPLANT
GOWN STRL REUS W/ TWL LRG LVL3 (GOWN DISPOSABLE) IMPLANT
GOWN STRL REUS W/TWL LRG LVL3 (GOWN DISPOSABLE)
GOWN STRL REUS W/TWL XL LVL3 (GOWN DISPOSABLE) ×9 IMPLANT
K-WIRE .035X4 (WIRE) ×9 IMPLANT
NEEDLE HYPO 25X1 1.5 SAFETY (NEEDLE) ×3 IMPLANT
NS IRRIG 1000ML POUR BTL (IV SOLUTION) IMPLANT
PACK BASIN DAY SURGERY FS (CUSTOM PROCEDURE TRAY) ×3 IMPLANT
PAD CAST 3X4 CTTN HI CHSV (CAST SUPPLIES) ×1 IMPLANT
PAD CAST 4YDX4 CTTN HI CHSV (CAST SUPPLIES) IMPLANT
PADDING CAST ABS 4INX4YD NS (CAST SUPPLIES) ×2
PADDING CAST ABS COTTON 4X4 ST (CAST SUPPLIES) ×1 IMPLANT
PADDING CAST COTTON 3X4 STRL (CAST SUPPLIES) ×3
PADDING CAST COTTON 4X4 STRL (CAST SUPPLIES)
PADDING UNDERCAST 2 STRL (CAST SUPPLIES) ×2
PADDING UNDERCAST 2X4 STRL (CAST SUPPLIES) ×1 IMPLANT
SHEET MEDIUM DRAPE 40X70 STRL (DRAPES) ×3 IMPLANT
SPLINT FINGER 3.25 911903 (SOFTGOODS) ×3 IMPLANT
SPLINT PLASTER CAST XFAST 4X15 (CAST SUPPLIES) IMPLANT
SPLINT PLASTER XTRA FAST SET 4 (CAST SUPPLIES)
STOCKINETTE 4X48 STRL (DRAPES) ×3 IMPLANT
STRIP CLOSURE SKIN 1/2X4 (GAUZE/BANDAGES/DRESSINGS) IMPLANT
SUCTION FRAZIER TIP 10 FR DISP (SUCTIONS) IMPLANT
SUT ETHILON 4 0 PS 2 18 (SUTURE) IMPLANT
SUT ETHILON 5 0 PS 2 18 (SUTURE) IMPLANT
SUT MERSILENE 4 0 P 3 (SUTURE) IMPLANT
SUT VIC AB 4-0 P-3 18XBRD (SUTURE) IMPLANT
SUT VIC AB 4-0 P3 18 (SUTURE)
SUT VICRYL RAPIDE 4-0 (SUTURE) IMPLANT
SUT VICRYL RAPIDE 4/0 PS 2 (SUTURE) IMPLANT
SYR BULB 3OZ (MISCELLANEOUS) IMPLANT
SYRINGE 12CC LL (MISCELLANEOUS) IMPLANT
TOWEL OR 17X24 6PK STRL BLUE (TOWEL DISPOSABLE) ×3 IMPLANT
TUBE CONNECTING 20'X1/4 (TUBING)
TUBE CONNECTING 20X1/4 (TUBING) IMPLANT
UNDERPAD 30X30 INCONTINENT (UNDERPADS AND DIAPERS) ×3 IMPLANT

## 2013-09-07 NOTE — Transfer of Care (Signed)
Immediate Anesthesia Transfer of Care Note  Patient: Rachael Goodman  Procedure(s) Performed: Procedure(s): CLOSED REDUCTION FINGER WITH PERCUTANEOUS PINNING    RIGHT RING FINGER MIDDLE PHALANX Application of external fixator (Right)  Patient Location: PACU  Anesthesia Type:General  Level of Consciousness: awake and sedated  Airway & Oxygen Therapy: Patient Spontanous Breathing and Patient connected to face mask oxygen  Post-op Assessment: Report given to PACU RN and Post -op Vital signs reviewed and stable  Post vital signs: Reviewed and stable  Complications: No apparent anesthesia complications

## 2013-09-07 NOTE — H&P (Signed)
Rachael Goodman is an 48 y.o. female.   Chief Complaint: right ring finger pain HPI: as above s/p right ring finger trauma with displaced middle phalanx fracture  History reviewed. No pertinent past medical history.  Past Surgical History  Procedure Laterality Date  . Balloon dilation  10/25/2011    Procedure: BALLOON DILATION;  Surgeon: Rachael Houston, MD;  Location: [facility];  Service: Endoscopy;  Laterality: N/A;  Rachael Goodman dilation  10/25/2011    Procedure: Rachael Goodman DILATION;  Surgeon: Rachael Houston, MD;  Location: [facility];  Service: Endoscopy;  Laterality: N/A;  . Savory dilation  10/25/2011    Procedure: SAVORY DILATION;  Surgeon: Rachael Houston, MD;  Location: [facility];  Service: Endoscopy;  Laterality: N/A;  . Abdominal hysterectomy  2012    panniculectomy  . Tonsillectomy      History reviewed. No pertinent family history. Social History:  reports that she has been smoking.  She does not have any smokeless tobacco history on file. She reports that she drinks alcohol. She reports that she does not use illicit drugs.  Allergies: No Known Allergies  Medications Prior to Admission  Medication Sig Dispense Refill  . amphetamine-dextroamphetamine (ADDERALL XR) 20 MG 24 hr capsule Take 20 mg by mouth every morning.      Marland Kitchen HYDROcodone-acetaminophen (NORCO/VICODIN) 5-325 MG per tablet Take 1 tablet by mouth every 6 (six) hours as needed for moderate pain.        Results for orders placed during the hospital encounter of 09/07/13 (from the past 48 hour(s))  POCT HEMOGLOBIN-HEMACUE     Status: None   Collection Time    09/07/13 10:47 AM      Result Value Ref Range   Hemoglobin 14.2  12.0 - 15.0 g/dL   No results found.  Review of Systems  All other systems reviewed and are negative.   Blood pressure 100/64, pulse 72, temperature 98.5 F (36.9 C), temperature source Oral, resp. rate 20, height 5\' 6"  (1.676 m), weight 81.194 kg (179 lb), SpO2 98.00%. Physical  Exam  Constitutional: She is oriented to person, place, and time. She appears well-developed and well-nourished.  HENT:  Head: Normocephalic and atraumatic.  Cardiovascular: Normal rate.   Respiratory: Effort normal.  Musculoskeletal:       Right hand: She exhibits tenderness, bony tenderness and deformity.  Displaced right ring middle phalanx fracture  Neurological: She is alert and oriented to person, place, and time.  Skin: Skin is warm.  Psychiatric: She has a normal mood and affect. Her behavior is normal. Judgment and thought content normal.     Assessment/Plan As above   Plan CRPP vs ORIF  Rachael Goodman A 09/07/2013, 12:39 PM

## 2013-09-07 NOTE — Anesthesia Procedure Notes (Signed)
Procedure Name: LMA Insertion Performed by: Geoffery Aultman W Pre-anesthesia Checklist: Patient identified, Timeout performed, Emergency Drugs available, Suction available and Patient being monitored Patient Re-evaluated:Patient Re-evaluated prior to inductionOxygen Delivery Method: Circle system utilized Preoxygenation: Pre-oxygenation with 100% oxygen Intubation Type: IV induction Ventilation: Mask ventilation without difficulty LMA: LMA inserted LMA Size: 4.0 Number of attempts: 1 Placement Confirmation: positive ETCO2 Tube secured with: Tape Dental Injury: Teeth and Oropharynx as per pre-operative assessment      

## 2013-09-07 NOTE — Anesthesia Postprocedure Evaluation (Signed)
  Anesthesia Post-op Note  Patient: Rachael Goodman  Procedure(s) Performed: Procedure(s): CLOSED REDUCTION FINGER WITH PERCUTANEOUS PINNING    RIGHT RING FINGER MIDDLE PHALANX Application of external fixator (Right)  Patient Location: PACU  Anesthesia Type: General   Level of Consciousness: awake, alert  and oriented  Airway and Oxygen Therapy: Patient Spontanous Breathing  Post-op Pain: mild  Post-op Assessment: Post-op Vital signs reviewed  Post-op Vital Signs: Reviewed  Last Vitals:  Filed Vitals:   09/07/13 1415  BP: 109/66  Pulse: 63  Temp:   Resp: 13    Complications: No apparent anesthesia complications

## 2013-09-07 NOTE — Anesthesia Preprocedure Evaluation (Addendum)
Anesthesia Evaluation  Patient identified by MRN, date of birth, ID band Patient awake    Reviewed: Allergy & Precautions, H&P , NPO status , Patient's Chart, lab work & pertinent test results  Airway Mallampati: I TM Distance: >3 FB Neck ROM: Full    Dental no notable dental hx. (+) Teeth Intact, Dental Advisory Given   Pulmonary Current Smoker,  breath sounds clear to auscultation  Pulmonary exam normal       Cardiovascular negative cardio ROS  Rhythm:Regular Rate:Normal     Neuro/Psych negative neurological ROS  negative psych ROS   GI/Hepatic negative GI ROS, Neg liver ROS,   Endo/Other  negative endocrine ROS  Renal/GU negative Renal ROS  negative genitourinary   Musculoskeletal   Abdominal   Peds  Hematology negative hematology ROS (+)   Anesthesia Other Findings   Reproductive/Obstetrics negative OB ROS                          Anesthesia Physical Anesthesia Plan  ASA: II  Anesthesia Plan: General   Post-op Pain Management:    Induction: Intravenous  Airway Management Planned: LMA  Additional Equipment:   Intra-op Plan:   Post-operative Plan: Extubation in OR  Informed Consent: I have reviewed the patients History and Physical, chart, labs and discussed the procedure including the risks, benefits and alternatives for the proposed anesthesia with the patient or authorized representative who has indicated his/her understanding and acceptance.   Dental advisory given  Plan Discussed with: CRNA  Anesthesia Plan Comments:         Anesthesia Quick Evaluation

## 2013-09-07 NOTE — Discharge Instructions (Signed)

## 2013-09-07 NOTE — Op Note (Signed)
See note [phone]

## 2013-09-08 ENCOUNTER — Encounter (HOSPITAL_BASED_OUTPATIENT_CLINIC_OR_DEPARTMENT_OTHER): Payer: Self-pay | Admitting: Orthopedic Surgery

## 2013-09-09 NOTE — Op Note (Signed)
NAME:  Rachael Goodman                    ACCOUNT NO.:  MEDICAL RECORD NO.:  [id number]  LOCATION:                                 FACILITY:  PHYSICIAN:  Sheral Apley. Burney Gauze, M.D.   DATE OF BIRTH:  DATE OF PROCEDURE:  09/07/2013 DATE OF DISCHARGE:                              OPERATIVE REPORT   PREOPERATIVE DIAGNOSIS:  Displaced right ring finger middle phalangeal fracture.  POSTOPERATIVE DIAGNOSIS:  Displaced right ring finger middle phalangeal fracture.  PROCEDURE:  Application of external fixator, right ring finger.  SURGEON:  Sheral Apley. Burney Gauze, M.D.  ASSISTANT:  Julian Reil, P.A.  ANESTHESIA:  General.  COMPLICATIONS:  None.  DRAINS:  None.  PROCEDURE IN DETAIL:  The patient was taken to the operating suite. After induction of adequate general anesthesia, right upper extremity was prepped and draped in sterile fashion.  An Esmarch was used to exsanguinate the limb.  Tourniquet was inflated to 250 mmHg.  At this point in time, a longitudinal traction was placed from the DIP joint distally.  There was a fracture of the middle phalanx at the DIP joint intra-articular with bicondylar involvement and extension along the radial shaft.  We placed an 0.35 K-wire through an 18-gauge needle hub to act as a unilateral framed external fixator, which was driven through the plastic hub into the middle phalanx along the ulnar side and going radially parallel to the joint surface.  Under longitudinal traction and more proximal pin was placed proximal to the fracture site to span this acting as an external fixator.  A third K-wire was then placed to the frame into the distal fragment as well.  Intraoperative fluoroscopy, AP, lateral, and oblique showed good reduction of the joint surface and maintenance of length.  The K-wires were cut outside the skin and bent upon themselves.  The patient was dressed with Xeroform, 4x4s, and a finger splint.  The patient tolerated the  procedure well and went to the recovery room in a stable fashion.     Sheral Apley Burney Gauze, M.D.    MAW/MEDQ  D:  09/07/2013  T:  09/08/2013  Job:  [id number]

## 2014-07-19 ENCOUNTER — Other Ambulatory Visit (HOSPITAL_COMMUNITY): Payer: Self-pay | Admitting: Internal Medicine

## 2014-07-19 DIAGNOSIS — Z1231 Encounter for screening mammogram for malignant neoplasm of breast: Secondary | ICD-10-CM

## 2014-08-09 ENCOUNTER — Ambulatory Visit (HOSPITAL_COMMUNITY): Payer: 59

## 2014-11-01 ENCOUNTER — Ambulatory Visit (HOSPITAL_COMMUNITY): Payer: 59

## 2014-11-03 ENCOUNTER — Ambulatory Visit (HOSPITAL_COMMUNITY)
Admission: RE | Admit: 2014-11-03 | Discharge: 2014-11-03 | Disposition: A | Discharge status: Home / Self Care | Payer: 59 | Source: Ambulatory Visit | Attending: Internal Medicine | Admitting: Internal Medicine

## 2014-11-03 DIAGNOSIS — Z1231 Encounter for screening mammogram for malignant neoplasm of breast: Secondary | ICD-10-CM | POA: Diagnosis not present

## 2014-11-08 ENCOUNTER — Ambulatory Visit (HOSPITAL_COMMUNITY): Payer: 59

## 2015-02-08 ENCOUNTER — Ambulatory Visit (HOSPITAL_COMMUNITY)
Admission: RE | Admit: 2015-02-08 | Discharge: 2015-02-08 | Disposition: A | Discharge status: Home / Self Care | Payer: 59 | Source: Ambulatory Visit | Attending: Internal Medicine | Admitting: Internal Medicine

## 2015-02-08 ENCOUNTER — Other Ambulatory Visit (HOSPITAL_COMMUNITY): Payer: Self-pay | Admitting: Internal Medicine

## 2015-02-08 DIAGNOSIS — S99922A Unspecified injury of left foot, initial encounter: Secondary | ICD-10-CM

## 2015-02-08 DIAGNOSIS — W06XXXA Fall from bed, initial encounter: Secondary | ICD-10-CM | POA: Diagnosis not present

## 2015-02-08 DIAGNOSIS — M79672 Pain in left foot: Secondary | ICD-10-CM | POA: Insufficient documentation

## 2015-02-08 DIAGNOSIS — S92002A Unspecified fracture of left calcaneus, initial encounter for closed fracture: Secondary | ICD-10-CM | POA: Insufficient documentation

## 2015-02-22 DIAGNOSIS — S92152D Displaced avulsion fracture (chip fracture) of left talus, subsequent encounter for fracture with routine healing: Secondary | ICD-10-CM | POA: Diagnosis not present

## 2015-02-22 DIAGNOSIS — S99912D Unspecified injury of left ankle, subsequent encounter: Secondary | ICD-10-CM | POA: Diagnosis not present

## 2015-02-22 DIAGNOSIS — S99922D Unspecified injury of left foot, subsequent encounter: Secondary | ICD-10-CM | POA: Diagnosis not present

## 2015-02-25 MED FILL — TOPIRAMATE 50 MG TABLET: 50 | 30 days supply | Qty: 60 | Fill #0

## 2015-03-02 MED FILL — DEXTROAMP-AMPHET ER 20 MG C: 20 | 30 days supply | Qty: 30 | Fill #0

## 2015-03-08 DIAGNOSIS — M25572 Pain in left ankle and joints of left foot: Secondary | ICD-10-CM | POA: Diagnosis not present

## 2015-03-08 DIAGNOSIS — S92152D Displaced avulsion fracture (chip fracture) of left talus, subsequent encounter for fracture with routine healing: Secondary | ICD-10-CM | POA: Diagnosis not present

## 2015-03-08 DIAGNOSIS — M25872 Other specified joint disorders, left ankle and foot: Secondary | ICD-10-CM | POA: Diagnosis not present

## 2015-03-08 DIAGNOSIS — M24472 Recurrent dislocation, left ankle: Secondary | ICD-10-CM | POA: Diagnosis not present

## 2015-03-18 DIAGNOSIS — S92153D Displaced avulsion fracture (chip fracture) of unspecified talus, subsequent encounter for fracture with routine healing: Secondary | ICD-10-CM | POA: Diagnosis not present

## 2015-04-20 MED FILL — DEXTROAMP-AMPHET ER 20 MG C: 20 | 30 days supply | Qty: 30 | Fill #0

## 2015-05-16 DIAGNOSIS — J069 Acute upper respiratory infection, unspecified: Secondary | ICD-10-CM | POA: Diagnosis not present

## 2015-05-16 MED FILL — AZITHROMYCIN 250 MG TABLET: 250 | 5 days supply | Qty: 6 | Fill #0

## 2015-05-17 MED FILL — FLUCONAZOLE 150 MG TABLET: 150 | 1 days supply | Qty: 1 | Fill #0

## 2015-05-17 MED FILL — levoFLOXacin 500 MG TABS: 500 | 7 days supply | Qty: 7 | Fill #0

## 2015-05-27 MED FILL — DEXTROAMP-AMPHET ER 20 MG C: 20 | 30 days supply | Qty: 30 | Fill #0

## 2015-06-27 MED FILL — DEXTROAMP-AMPHET ER 20 MG C: 20 | 30 days supply | Qty: 30 | Fill #0

## 2015-07-18 DIAGNOSIS — R252 Cramp and spasm: Secondary | ICD-10-CM | POA: Diagnosis not present

## 2015-08-03 MED FILL — DEXTROAMP-AMPHET ER 20 MG C: 20 | 30 days supply | Qty: 30 | Fill #0

## 2015-09-01 DIAGNOSIS — H35461 Secondary vitreoretinal degeneration, right eye: Secondary | ICD-10-CM | POA: Diagnosis not present

## 2015-09-01 DIAGNOSIS — H52223 Regular astigmatism, bilateral: Secondary | ICD-10-CM | POA: Diagnosis not present

## 2015-09-01 DIAGNOSIS — H5202 Hypermetropia, left eye: Secondary | ICD-10-CM | POA: Diagnosis not present

## 2015-09-01 DIAGNOSIS — H524 Presbyopia: Secondary | ICD-10-CM | POA: Diagnosis not present

## 2015-09-07 MED FILL — DEXTROAMP-AMPHET ER 20 MG C: 20 | 30 days supply | Qty: 30 | Fill #0

## 2015-10-11 MED FILL — ESOMEPRAZOLE MAG DR 40 MG C: 40 | 30 days supply | Qty: 30 | Fill #0

## 2015-10-12 ENCOUNTER — Other Ambulatory Visit (HOSPITAL_COMMUNITY): Payer: Self-pay | Admitting: Internal Medicine

## 2015-10-12 DIAGNOSIS — E049 Nontoxic goiter, unspecified: Secondary | ICD-10-CM

## 2015-10-12 MED FILL — DEXTROAMP-AMPHET ER 20 MG C: 20 | 30 days supply | Qty: 30 | Fill #0

## 2015-10-13 ENCOUNTER — Ambulatory Visit (HOSPITAL_COMMUNITY)
Admission: RE | Admit: 2015-10-13 | Discharge: 2015-10-13 | Disposition: A | Discharge status: Home / Self Care | Payer: 59 | Source: Ambulatory Visit | Attending: Internal Medicine | Admitting: Internal Medicine

## 2015-10-13 DIAGNOSIS — E041 Nontoxic single thyroid nodule: Secondary | ICD-10-CM | POA: Diagnosis not present

## 2015-10-13 DIAGNOSIS — E049 Nontoxic goiter, unspecified: Secondary | ICD-10-CM

## 2015-11-17 MED FILL — DEXTROAMP-AMPHET ER 20 MG C: 20 | 30 days supply | Qty: 30 | Fill #0

## 2015-11-28 DIAGNOSIS — Z23 Encounter for immunization: Secondary | ICD-10-CM | POA: Diagnosis not present

## 2015-12-28 MED FILL — ESOMEPRAZOLE MAG DR 40 MG C: 40 | 90 days supply | Qty: 90 | Fill #1

## 2015-12-30 DIAGNOSIS — Z6829 Body mass index (BMI) 29.0-29.9, adult: Secondary | ICD-10-CM | POA: Diagnosis not present

## 2015-12-30 DIAGNOSIS — Z1389 Encounter for screening for other disorder: Secondary | ICD-10-CM | POA: Diagnosis not present

## 2015-12-30 DIAGNOSIS — E663 Overweight: Secondary | ICD-10-CM | POA: Diagnosis not present

## 2015-12-30 DIAGNOSIS — F909 Attention-deficit hyperactivity disorder, unspecified type: Secondary | ICD-10-CM | POA: Diagnosis not present

## 2016-01-03 MED FILL — DEXTROAMP-AMPHET ER 20 MG C: 20 | 30 days supply | Qty: 30 | Fill #0

## 2016-02-02 MED FILL — DEXTROAMP-AMPHET ER 20 MG C: 20 | 30 days supply | Qty: 30 | Fill #0

## 2016-08-06 ENCOUNTER — Other Ambulatory Visit (HOSPITAL_COMMUNITY): Payer: Self-pay | Admitting: Internal Medicine

## 2016-08-06 DIAGNOSIS — E049 Nontoxic goiter, unspecified: Secondary | ICD-10-CM

## 2016-08-09 ENCOUNTER — Ambulatory Visit (HOSPITAL_COMMUNITY)
Admission: RE | Admit: 2016-08-09 | Discharge: 2016-08-09 | Disposition: A | Discharge status: Home / Self Care | Payer: 59 | Source: Ambulatory Visit | Attending: Internal Medicine | Admitting: Internal Medicine

## 2016-08-20 ENCOUNTER — Ambulatory Visit (HOSPITAL_COMMUNITY)
Admission: RE | Admit: 2016-08-20 | Discharge: 2016-08-20 | Disposition: A | Discharge status: Home / Self Care | Payer: 59 | Source: Ambulatory Visit | Attending: Internal Medicine | Admitting: Internal Medicine

## 2016-08-20 ENCOUNTER — Encounter (HOSPITAL_COMMUNITY): Payer: Self-pay

## 2016-09-11 ENCOUNTER — Other Ambulatory Visit: Payer: Self-pay | Admitting: Obstetrics and Gynecology

## 2016-09-13 ENCOUNTER — Encounter: Payer: Self-pay | Admitting: Obstetrics and Gynecology

## 2016-09-13 ENCOUNTER — Ambulatory Visit (INDEPENDENT_AMBULATORY_CARE_PROVIDER_SITE_OTHER): Payer: Managed Care, Other (non HMO) | Admitting: Obstetrics and Gynecology

## 2016-09-13 VITALS — BP 128/72 | HR 93 | Ht 64.75 in | Wt 186.5 lb

## 2016-09-13 DIAGNOSIS — Z1212 Encounter for screening for malignant neoplasm of rectum: Secondary | ICD-10-CM | POA: Diagnosis not present

## 2016-09-13 DIAGNOSIS — R198 Other specified symptoms and signs involving the digestive system and abdomen: Secondary | ICD-10-CM

## 2016-09-13 DIAGNOSIS — Z01419 Encounter for gynecological examination (general) (routine) without abnormal findings: Secondary | ICD-10-CM | POA: Diagnosis not present

## 2016-09-13 LAB — HEMOCCULT GUIAC POC 1CARD (OFFICE): Fecal Occult Blood, POC: NEGATIVE

## 2016-09-13 NOTE — Progress Notes (Addendum)
Assessment:  1. Annual Gyn Exam 2.  Minimal to Mild rectocele, symptomatic, s/p abd. hysterectomy and tummy tuck, pt dissatisfaction with splinting greater than PEx would suggest    Plan:  1. pap smear not done, next pap not needed due to absent cervix 2. return annually or prn 3    Annual mammogram advised 4.  F/u in 1 month  Subjective:  Rachael Goodman is a 51 y.o. female [id number] who presents for annual exam. No LMP recorded. Patient has had a hysterectomy. Pt reports occasional difficulty with pushing out bowel movements (both hard and soft BM) with problem occurring fot the past few years. She reports having to use her hand to put pressure on her posterior vagina walls to alleviate symptoms. she reports having to "splint" perineum even when bm's are soft. Pt is sexually active and denies dyspareunia. Pt had a hysterectomy 10 years ago.  The following portions of the patient's history were reviewed and updated as appropriate: allergies, current medications, past family history, past medical history, past social history, past surgical history and problem list. Past Medical History:  Diagnosis Date  . Hernia, hiatal     Past Surgical History:  Procedure Laterality Date  . ABDOMINAL HYSTERECTOMY  2012   panniculectomy  . BALLOON DILATION  10/25/2011   Procedure: BALLOON DILATION;  Surgeon: Rogene Houston, MD;  [facility];  Service: Endoscopy;  Laterality: N/A;  . CLOSED REDUCTION FINGER WITH PERCUTANEOUS PINNING Right 09/07/2013   Procedure: CLOSED REDUCTION FINGER WITH PERCUTANEOUS PINNING    RIGHT RING FINGER MIDDLE PHALANX Application of external fixator;  Surgeon: Schuyler Amor, MD;  Location: [facility];  Service: Orthopedics;  Laterality: Right;  . MALONEY DILATION  10/25/2011   Procedure: MALONEY DILATION;  Surgeon: Rogene Houston, MD;  [facility];  Service: Endoscopy;  Laterality: N/A;  . SAVORY DILATION  10/25/2011   Procedure:  SAVORY DILATION;  Surgeon: Rogene Houston, MD;  [facility]: [facility];  Service: Endoscopy;  Laterality: N/A;  . TONSILLECTOMY       Current Outpatient Prescriptions:  .  amphetamine-dextroamphetamine (ADDERALL XR) 20 MG 24 hr capsule, Take 20 mg by mouth every morning., Disp: , Rfl:  .  hyoscyamine (ANASPAZ) 0.125 MG TBDP disintergrating tablet, Place 0.125 mg under the tongue 4 (four) times daily., Disp: , Rfl:   Review of Systems Constitutional: negative Gastrointestinal: negative Genitourinary: negative  Objective:  BP 128/72 (BP Location: Left Arm, Patient Position: Sitting, Cuff Size: Normal)   Pulse 93   Ht 5' 4.75" (1.645 m)   Wt 186 lb 8 oz (84.6 kg)   BMI 31.28 kg/m    BMI: Body mass index is 31.28 kg/m.  General Appearance: Alert, appropriate appearance for age. No acute distress HEENT: Grossly normal Neck / Thyroid:  Cardiovascular: RRR; normal S1, S2, no murmur Lungs: CTA bilaterally Back: No CVAT Breast Exam: No masses or nodes.No dimpling, nipple retraction or discharge. Gastrointestinal: Soft, non-tender, no masses or organomegaly Pelvic Exam: External genitalia: normal general appearance Vaginal: normal mucosa without prolapse or lesions, good support at the top, good support posteriorly Cervix: removed surgically Adnexa: normal bimanual exam Uterus: removed surgically Rectal: good sphincter tone, no masses and guaiac negative. Support seems better than pt symptoms would suggest Lymphatic Exam: Non-palpable nodes in neck, clavicular, axillary, or inguinal regions  Skin: no rash or abnormalities Neurologic: Normal gait and speech, no tremor  Psychiatric: Alert and oriented, appropriate affect.  Urinalysis:Not done  Mallory Shirk. MD Pgr [phone] 3:13 PM    By signing my name below, I, Izna Ahmed, attest that this documentation has been prepared under the direction and in the presence of Jonnie Kind, MD. Electronically Signed: Jabier Gauss,  Medical Scribe. 09/13/16. 3:13 PM.  I personally performed the services described in this documentation, which was SCRIBED in my presence. The recorded information has been reviewed and considered accurate. It has been edited as necessary during review. Jonnie Kind, MD

## 2016-10-22 ENCOUNTER — Ambulatory Visit: Payer: Managed Care, Other (non HMO) | Admitting: Obstetrics and Gynecology

## 2016-11-12 ENCOUNTER — Ambulatory Visit: Payer: Managed Care, Other (non HMO) | Admitting: Obstetrics and Gynecology

## 2016-12-31 DIAGNOSIS — F909 Attention-deficit hyperactivity disorder, unspecified type: Secondary | ICD-10-CM | POA: Diagnosis not present

## 2016-12-31 DIAGNOSIS — J329 Chronic sinusitis, unspecified: Secondary | ICD-10-CM | POA: Diagnosis not present

## 2016-12-31 DIAGNOSIS — Z6829 Body mass index (BMI) 29.0-29.9, adult: Secondary | ICD-10-CM | POA: Diagnosis not present

## 2016-12-31 DIAGNOSIS — E663 Overweight: Secondary | ICD-10-CM | POA: Diagnosis not present

## 2017-03-29 MED FILL — ADDERALL XR 20 MG CAP SA: 20 | 30 days supply | Qty: 30 | Fill #0

## 2017-04-29 DIAGNOSIS — F909 Attention-deficit hyperactivity disorder, unspecified type: Secondary | ICD-10-CM | POA: Diagnosis not present

## 2017-04-29 DIAGNOSIS — E6609 Other obesity due to excess calories: Secondary | ICD-10-CM | POA: Diagnosis not present

## 2017-04-29 DIAGNOSIS — Z1389 Encounter for screening for other disorder: Secondary | ICD-10-CM | POA: Diagnosis not present

## 2017-04-29 DIAGNOSIS — Z6830 Body mass index (BMI) 30.0-30.9, adult: Secondary | ICD-10-CM | POA: Diagnosis not present

## 2017-05-03 MED FILL — ADDERALL XR 25 MG CAPSULE: 25 | 30 days supply | Qty: 30 | Fill #0

## 2017-05-27 DIAGNOSIS — Z6830 Body mass index (BMI) 30.0-30.9, adult: Secondary | ICD-10-CM | POA: Diagnosis not present

## 2017-05-27 DIAGNOSIS — Z0001 Encounter for general adult medical examination with abnormal findings: Secondary | ICD-10-CM | POA: Diagnosis not present

## 2017-05-27 DIAGNOSIS — F909 Attention-deficit hyperactivity disorder, unspecified type: Secondary | ICD-10-CM | POA: Diagnosis not present

## 2017-05-27 DIAGNOSIS — Z1389 Encounter for screening for other disorder: Secondary | ICD-10-CM | POA: Diagnosis not present

## 2017-05-27 DIAGNOSIS — K219 Gastro-esophageal reflux disease without esophagitis: Secondary | ICD-10-CM | POA: Diagnosis not present

## 2017-05-27 DIAGNOSIS — E6609 Other obesity due to excess calories: Secondary | ICD-10-CM | POA: Diagnosis not present

## 2017-05-27 DIAGNOSIS — F419 Anxiety disorder, unspecified: Secondary | ICD-10-CM | POA: Diagnosis not present

## 2017-06-03 DIAGNOSIS — Z0000 Encounter for general adult medical examination without abnormal findings: Secondary | ICD-10-CM | POA: Diagnosis not present

## 2017-06-05 MED FILL — ADDERALL XR 25 MG CAPSULE: 25 | 30 days supply | Qty: 30 | Fill #0

## 2017-07-04 MED FILL — AMOXICILLIN 500 MG CAPSULE: 500 | 7 days supply | Qty: 21 | Fill #0

## 2017-07-12 MED FILL — ADDERALL XR 25 MG CAPSULE: 25 | 30 days supply | Qty: 30 | Fill #0

## 2017-08-21 MED FILL — ADDERALL XR 25 MG CAPSULE: 25 | 30 days supply | Qty: 30 | Fill #0

## 2017-09-24 MED FILL — ADDERALL XR 25 MG CAPSULE: 25 | 30 days supply | Qty: 30 | Fill #0

## 2017-09-27 DIAGNOSIS — M545 Low back pain: Secondary | ICD-10-CM | POA: Diagnosis not present

## 2017-09-27 DIAGNOSIS — Z6830 Body mass index (BMI) 30.0-30.9, adult: Secondary | ICD-10-CM | POA: Diagnosis not present

## 2017-09-27 DIAGNOSIS — Z1389 Encounter for screening for other disorder: Secondary | ICD-10-CM | POA: Diagnosis not present

## 2017-09-27 DIAGNOSIS — E669 Obesity, unspecified: Secondary | ICD-10-CM | POA: Diagnosis not present

## 2017-09-27 DIAGNOSIS — K219 Gastro-esophageal reflux disease without esophagitis: Secondary | ICD-10-CM | POA: Diagnosis not present

## 2017-09-27 DIAGNOSIS — F419 Anxiety disorder, unspecified: Secondary | ICD-10-CM | POA: Diagnosis not present

## 2017-09-27 DIAGNOSIS — M5136 Other intervertebral disc degeneration, lumbar region: Secondary | ICD-10-CM | POA: Diagnosis not present

## 2017-09-27 DIAGNOSIS — M5410 Radiculopathy, site unspecified: Secondary | ICD-10-CM | POA: Diagnosis not present

## 2017-10-15 MED FILL — METHYLPREDNISOLONE 4 MG TAB: 4 | 6 days supply | Qty: 21 | Fill #0

## 2017-10-15 MED FILL — CYCLOBENZAPRINE HCL 10 MG T: 10 | 14 days supply | Qty: 42 | Fill #0

## 2017-10-30 MED FILL — ADDERALL XR 25 MG CAPSULE: 25 | 30 days supply | Qty: 30 | Fill #0

## 2017-11-27 MED FILL — ADDERALL XR 25 MG CAPSULE: 25 | 30 days supply | Qty: 30 | Fill #0

## 2017-12-31 MED FILL — ADDERALL XR 25 MG CAPSULE: 25 | 30 days supply | Qty: 30 | Fill #0

## 2018-01-30 MED FILL — AMOXICILLIN 500 MG CAPSULE: 500 | 7 days supply | Qty: 21 | Fill #0

## 2018-02-03 MED FILL — traMADol HCL 50 MG TABS: 50 | 4 days supply | Qty: 16 | Fill #0

## 2018-02-10 DIAGNOSIS — F909 Attention-deficit hyperactivity disorder, unspecified type: Secondary | ICD-10-CM | POA: Diagnosis not present

## 2018-02-10 DIAGNOSIS — E6609 Other obesity due to excess calories: Secondary | ICD-10-CM | POA: Diagnosis not present

## 2018-02-10 DIAGNOSIS — Z6831 Body mass index (BMI) 31.0-31.9, adult: Secondary | ICD-10-CM | POA: Diagnosis not present

## 2018-02-10 MED FILL — ADDERALL XR 25 MG CAPSULE: 25 | 30 days supply | Qty: 30 | Fill #0

## 2018-03-18 MED FILL — ADDERALL XR 25 MG CAPSULE: 25 | 30 days supply | Qty: 30 | Fill #0

## 2018-04-18 MED FILL — ADDERALL XR 25 MG CAPSULE: 25 | 30 days supply | Qty: 30 | Fill #0

## 2018-05-02 MED FILL — traMADol HCL 50 MG TABS: 50 | 4 days supply | Qty: 18 | Fill #0

## 2018-05-02 MED FILL — AMOXICILLIN 500 MG CAPSULE: 500 | 7 days supply | Qty: 21 | Fill #0

## 2018-05-17 MED FILL — ADDERALL XR 25 MG CAPSULE: 25 | 30 days supply | Qty: 30 | Fill #0

## 2018-05-20 MED FILL — FLUCONAZOLE 150 MG TABS: 150 | 2 days supply | Qty: 2 | Fill #0

## 2018-05-20 MED FILL — AMOXICILLIN 500 MG CAPSULE: 500 | 7 days supply | Qty: 21 | Fill #0

## 2018-06-23 MED FILL — ADDERALL XR 25 MG CAPSULE: 25 | 30 days supply | Qty: 30 | Fill #0

## 2018-07-23 DIAGNOSIS — F909 Attention-deficit hyperactivity disorder, unspecified type: Secondary | ICD-10-CM | POA: Diagnosis not present

## 2018-07-23 MED FILL — ADDERALL XR 25 MG CAPSULE: 25 | 30 days supply | Qty: 30 | Fill #0

## 2018-08-11 MED FILL — AMOX-CLAV 875-125 MG TABLET: 875-125 | 7 days supply | Qty: 14 | Fill #0

## 2018-08-22 MED FILL — ADDERALL XR 25 MG CAPSULE: 25 | 30 days supply | Qty: 30 | Fill #0

## 2018-09-23 MED FILL — ADDERALL XR 25 MG CAPSULE: 25 | 30 days supply | Qty: 30 | Fill #0

## 2018-09-30 MED FILL — AMOXICILLIN 500 MG CAPSULE: 500 | 7 days supply | Qty: 21 | Fill #0

## 2018-10-02 MED FILL — PROMETHAZINE 25 MG TABLET: 25 | 3 days supply | Qty: 10 | Fill #0

## 2018-10-02 MED FILL — traMADol HCL 50 MG TABS: 50 | 5 days supply | Qty: 18 | Fill #0

## 2018-10-09 DIAGNOSIS — Z6831 Body mass index (BMI) 31.0-31.9, adult: Secondary | ICD-10-CM | POA: Diagnosis not present

## 2018-10-09 DIAGNOSIS — R109 Unspecified abdominal pain: Secondary | ICD-10-CM | POA: Diagnosis not present

## 2018-10-09 DIAGNOSIS — R1013 Epigastric pain: Secondary | ICD-10-CM | POA: Diagnosis not present

## 2018-10-09 DIAGNOSIS — F909 Attention-deficit hyperactivity disorder, unspecified type: Secondary | ICD-10-CM | POA: Diagnosis not present

## 2018-10-09 DIAGNOSIS — J340 Abscess, furuncle and carbuncle of nose: Secondary | ICD-10-CM | POA: Diagnosis not present

## 2018-10-09 MED FILL — SUCRALFATE 1 GM TABLET: 1 | 20 days supply | Qty: 80 | Fill #0

## 2018-10-09 MED FILL — PANTOPRAZOLE SOD DR 40 MG T: 40 | 30 days supply | Qty: 60 | Fill #0

## 2018-10-09 MED FILL — oxyCODONE HCL 10 MG TABS: 10 | 5 days supply | Qty: 20 | Fill #0

## 2018-10-09 MED FILL — DOXYCYCLINE HYCLATE 100 MG: 100 | 7 days supply | Qty: 14 | Fill #0

## 2018-10-27 MED FILL — ADDERALL XR 25 MG CAPSULE: 25 | 30 days supply | Qty: 30 | Fill #0

## 2018-10-30 ENCOUNTER — Other Ambulatory Visit (HOSPITAL_COMMUNITY): Payer: Self-pay | Admitting: Internal Medicine

## 2018-10-30 DIAGNOSIS — Z1231 Encounter for screening mammogram for malignant neoplasm of breast: Secondary | ICD-10-CM

## 2018-11-25 MED FILL — ADDERALL XR 25 MG CAPSULE: 25 | 30 days supply | Qty: 30 | Fill #0

## 2018-12-17 ENCOUNTER — Emergency Department (HOSPITAL_COMMUNITY)
Admission: EM | Admit: 2018-12-17 | Discharge: 2018-12-17 | Disposition: A | Discharge status: Home / Self Care | Payer: 59 | Attending: Emergency Medicine | Admitting: Emergency Medicine

## 2018-12-17 ENCOUNTER — Emergency Department (HOSPITAL_COMMUNITY): Payer: 59

## 2018-12-17 ENCOUNTER — Encounter (HOSPITAL_COMMUNITY): Payer: Self-pay | Admitting: Obstetrics and Gynecology

## 2018-12-17 ENCOUNTER — Other Ambulatory Visit: Payer: Self-pay

## 2018-12-17 DIAGNOSIS — K2900 Acute gastritis without bleeding: Secondary | ICD-10-CM | POA: Diagnosis not present

## 2018-12-17 DIAGNOSIS — F17210 Nicotine dependence, cigarettes, uncomplicated: Secondary | ICD-10-CM | POA: Diagnosis not present

## 2018-12-17 DIAGNOSIS — Z79899 Other long term (current) drug therapy: Secondary | ICD-10-CM | POA: Diagnosis not present

## 2018-12-17 DIAGNOSIS — R109 Unspecified abdominal pain: Secondary | ICD-10-CM | POA: Diagnosis not present

## 2018-12-17 DIAGNOSIS — R1013 Epigastric pain: Secondary | ICD-10-CM | POA: Diagnosis present

## 2018-12-17 DIAGNOSIS — R1011 Right upper quadrant pain: Secondary | ICD-10-CM | POA: Diagnosis not present

## 2018-12-17 DIAGNOSIS — Z681 Body mass index (BMI) 19 or less, adult: Secondary | ICD-10-CM | POA: Diagnosis not present

## 2018-12-17 LAB — COMPREHENSIVE METABOLIC PANEL
ALT: 19 U/L (ref 0–44)
AST: 18 U/L (ref 15–41)
Albumin: 4.4 g/dL (ref 3.5–5.0)
Alkaline Phosphatase: 76 U/L (ref 38–126)
Anion gap: 9 (ref 5–15)
BUN: 10 mg/dL (ref 6–20)
CO2: 26 mmol/L (ref 22–32)
Calcium: 9 mg/dL (ref 8.9–10.3)
Chloride: 104 mmol/L (ref 98–111)
Creatinine, Ser: 0.99 mg/dL (ref 0.44–1.00)
GFR calc Af Amer: >60 mL/min (ref >60)
GFR calc non Af Amer: >60 mL/min (ref >60)
Glucose, Bld: 96 mg/dL (ref 70–99)
Potassium: 3.8 mmol/L (ref 3.5–5.1)
Sodium: 139 mmol/L (ref 135–145)
Total Bilirubin: 0.5 mg/dL (ref 0.3–1.2)
Total Protein: 7.8 g/dL (ref 6.5–8.1)

## 2018-12-17 LAB — URINALYSIS, ROUTINE W REFLEX MICROSCOPIC
Bacteria, UA: NONE SEEN
Bilirubin Urine: NEGATIVE
Glucose, UA: NEGATIVE mg/dL
Ketones, ur: NEGATIVE mg/dL
Leukocytes,Ua: NEGATIVE
Nitrite: NEGATIVE
Protein, ur: NEGATIVE mg/dL
Specific Gravity, Urine: 1.01 (ref 1.005–1.030)
pH: 5 (ref 5.0–8.0)

## 2018-12-17 LAB — CBC
HCT: 47.4 % — ABNORMAL HIGH (ref 36.0–46.0)
Hemoglobin: 14.7 g/dL (ref 12.0–15.0)
MCH: 28.2 pg (ref 26.0–34.0)
MCHC: 31 g/dL (ref 30.0–36.0)
MCV: 90.8 fL (ref 80.0–100.0)
Platelets: 333 10*3/uL (ref 150–400)
RBC: 5.22 MIL/uL — ABNORMAL HIGH (ref 3.87–5.11)
RDW: 13.5 % (ref 11.5–15.5)
WBC: 7.5 10*3/uL (ref 4.0–10.5)
nRBC: 0 % (ref 0.0–0.2)

## 2018-12-17 LAB — LIPASE, BLOOD: Lipase: 39 U/L (ref 11–51)

## 2018-12-17 LAB — PREGNANCY, URINE: Preg Test, Ur: NEGATIVE

## 2018-12-17 MED ORDER — HYDROCODONE-ACETAMINOPHEN 5-325 MG PO TABS: 1.0000 | ORAL_TABLET | Freq: Four times a day (QID) | ORAL | 0 refills | Status: DC | PRN

## 2018-12-17 MED ORDER — ONDANSETRON HCL 4 MG/2ML IJ SOLN
4.0000 mg | Freq: Once | INTRAMUSCULAR | Status: AC
  Administered 2018-12-17: 4 mg via INTRAVENOUS
  Filled 2018-12-17: qty 2

## 2018-12-17 MED ORDER — SODIUM CHLORIDE 0.9 % IV BOLUS
500.0000 mL | Freq: Once | INTRAVENOUS | Status: AC
  Administered 2018-12-17: 17:00:00 500 mL via INTRAVENOUS

## 2018-12-17 MED ORDER — PANTOPRAZOLE SODIUM 20 MG PO TBEC: 20.0000 mg | DELAYED_RELEASE_TABLET | Freq: Every day | ORAL | 0 refills | Status: DC

## 2018-12-17 MED ORDER — SODIUM CHLORIDE 0.9% FLUSH
3.0000 mL | Freq: Once | INTRAVENOUS | Status: AC
  Administered 2018-12-17: 3 mL via INTRAVENOUS

## 2018-12-17 MED ORDER — ONDANSETRON 4 MG PO TBDP: ORAL_TABLET | ORAL | 0 refills | Status: DC

## 2018-12-17 MED ORDER — OXYCODONE-ACETAMINOPHEN 5-325 MG PO TABS
1.0000 | ORAL_TABLET | ORAL | Status: DC | PRN
  Administered 2018-12-17: 15:00:00 1 via ORAL
  Filled 2018-12-17: qty 1

## 2018-12-17 MED ORDER — PANTOPRAZOLE SODIUM 40 MG IV SOLR
40.0000 mg | Freq: Once | INTRAVENOUS | Status: AC
  Administered 2018-12-17: 40 mg via INTRAVENOUS
  Filled 2018-12-17: qty 40

## 2018-12-17 NOTE — ED Triage Notes (Signed)
Patient reports RUQ pain that she thinks is related to her gallbladder. Patient also reports she has a hernia. Pt denies N/V/D. Patient endorses reflux. Pt denies having periods

## 2018-12-17 NOTE — ED Notes (Signed)
US at bedside

## 2018-12-17 NOTE — Discharge Instructions (Addendum)
Follow-up with your family doctor next week for recheck. 

## 2018-12-17 NOTE — ED Provider Notes (Signed)
[facility] DEPT Provider Note   CSN: [id number] Arrival date & time: 12/17/18  1355     History   Chief Complaint Chief Complaint  Patient presents with  . Abdominal Pain    HPI Rachael Goodman is a 53 y.o. female.     Patient complains of epigastric pain.  She is concerned about her gallbladder.  The history is provided by the patient. No language interpreter was used.  Abdominal Pain Pain location:  Epigastric Pain quality: aching   Pain radiates to:  Does not radiate Pain severity:  Moderate Onset quality:  Sudden Timing:  Constant Progression:  Unchanged Chronicity:  New Context: not alcohol use   Relieved by:  Nothing Worsened by:  Nothing Associated symptoms: no chest pain, no cough, no diarrhea, no fatigue and no hematuria     Past Medical History:  Diagnosis Date  . Hernia, hiatal     Patient Active Problem List   Diagnosis Date Noted  . Alternating constipation and diarrhea 09/27/2011  . Esophageal dysphagia 09/27/2011    Past Surgical History:  Procedure Laterality Date  . ABDOMINAL HYSTERECTOMY  2012   panniculectomy  . BALLOON DILATION  10/25/2011   Procedure: BALLOON DILATION;  Surgeon: Rogene Houston, MD;  [facility];  Service: Endoscopy;  Laterality: N/A;  . CLOSED REDUCTION FINGER WITH PERCUTANEOUS PINNING Right 09/07/2013   Procedure: CLOSED REDUCTION FINGER WITH PERCUTANEOUS PINNING    RIGHT RING FINGER MIDDLE PHALANX Application of external fixator;  Surgeon: Schuyler Amor, MD;  Location: [facility];  Service: Orthopedics;  Laterality: Right;  . MALONEY DILATION  10/25/2011   Procedure: MALONEY DILATION;  Surgeon: Rogene Houston, MD;  [facility];  Service: Endoscopy;  Laterality: N/A;  . SAVORY DILATION  10/25/2011   Procedure: SAVORY DILATION;  Surgeon: Rogene Houston, MD;  [facility];  Service: Endoscopy;  Laterality: N/A;  . TONSILLECTOMY        OB History    Gravida  3   Para  1   Term  1   Preterm      AB  2   Living  1     SAB  2   TAB      Ectopic      Multiple      Live Births  1            Home Medications    Prior to Admission medications   Medication Sig Start Date End Date Taking? Authorizing Provider  amphetamine-dextroamphetamine (ADDERALL XR) 20 MG 24 hr capsule Take 20 mg by mouth every morning.    [provider]  HYDROcodone-acetaminophen (NORCO/VICODIN) 5-325 MG tablet Take 1 tablet by mouth every 6 (six) hours as needed for moderate pain. 12/17/18   Milton Ferguson, MD  hyoscyamine (ANASPAZ) 0.125 MG TBDP disintergrating tablet Place 0.125 mg under the tongue 4 (four) times daily.    [provider]  ondansetron (ZOFRAN ODT) 4 MG disintegrating tablet 4mg  ODT q4 hours prn nausea/vomit 12/17/18   Milton Ferguson, MD  pantoprazole (PROTONIX) 20 MG tablet Take 1 tablet (20 mg total) by mouth daily. 12/17/18   Milton Ferguson, MD    Family History Family History  Problem Relation Age of Onset  . Addison's disease Mother   . Diabetes Mother   . Hypertension Mother   . Hypertension Father   . Congestive Heart Failure Father   . Hypertension Brother   .  Congestive Heart Failure Maternal Grandmother   . Congestive Heart Failure Maternal Grandfather     Social History Social History   Tobacco Use  . Smoking status: Current Every Day Smoker    Packs/day: 0.50    Years: 30.00    Pack years: 15.00    Types: Cigarettes  . Smokeless tobacco: Never Used  . Tobacco comment: 1 pack every two days  Substance Use Topics  . Alcohol use: Yes    Comment: glass of wine rarely  . Drug use: No     Allergies   Patient has no known allergies.   Review of Systems Review of Systems  Constitutional: Negative for appetite change and fatigue.  HENT: Negative for congestion, ear discharge and sinus pressure.   Eyes: Negative for discharge.  Respiratory: Negative for cough.    Cardiovascular: Negative for chest pain.  Gastrointestinal: Positive for abdominal pain. Negative for diarrhea.  Genitourinary: Negative for frequency and hematuria.  Musculoskeletal: Negative for back pain.  Skin: Negative for rash.  Neurological: Negative for seizures and headaches.  Psychiatric/Behavioral: Negative for hallucinations.     Physical Exam Updated Vital Signs BP (!) 149/69 (BP Location: Right Arm)   Pulse 91   Temp 98 F (36.7 C) (Oral)   Resp 18   SpO2 100%   Physical Exam Vitals signs and nursing note reviewed.  Constitutional:      Appearance: She is well-developed.  HENT:     Head: Normocephalic.     Nose: Nose normal.  Eyes:     General: No scleral icterus.    Conjunctiva/sclera: Conjunctivae normal.  Neck:     Musculoskeletal: Neck supple.     Thyroid: No thyromegaly.  Cardiovascular:     Rate and Rhythm: Normal rate and regular rhythm.     Heart sounds: No murmur. No friction rub. No gallop.   Pulmonary:     Breath sounds: No stridor. No wheezing or rales.  Chest:     Chest wall: No tenderness.  Abdominal:     General: There is no distension.     Tenderness: There is abdominal tenderness. There is no rebound.  Musculoskeletal: Normal range of motion.  Lymphadenopathy:     Cervical: No cervical adenopathy.  Skin:    Findings: No erythema or rash.  Neurological:     Mental Status: She is alert and oriented to person, place, and time.     Motor: No abnormal muscle tone.     Coordination: Coordination normal.  Psychiatric:        Behavior: Behavior normal.      ED Treatments / Results  Labs (all labs ordered are listed, but only abnormal results are displayed) Labs Reviewed  CBC - Abnormal; Notable for the following components:      Result Value   RBC 5.22 (*)    HCT 47.4 (*)    All other components within normal limits  URINALYSIS, ROUTINE W REFLEX MICROSCOPIC - Abnormal; Notable for the following components:   Hgb urine dipstick  SMALL (*)    All other components within normal limits  LIPASE, BLOOD  COMPREHENSIVE METABOLIC PANEL  PREGNANCY, URINE    EKG None  Radiology US Abdomen Complete  Result Date: 12/17/2018 CLINICAL DATA:  Abdomen pain EXAM: ABDOMEN ULTRASOUND COMPLETE COMPARISON:  None. FINDINGS: Gallbladder: No gallstones or wall thickening visualized. No sonographic Murphy sign noted by sonographer. Common bile duct: Diameter: 2.6 mm Liver: Increased hepatic echogenicity. No focal hepatic abnormality. Portal vein is patent on color  Doppler imaging with normal direction of blood flow towards the liver. IVC: No abnormality visualized. Pancreas: Visualized portion unremarkable. Spleen: Size and appearance within normal limits. Right Kidney: Length: 10 cm. Echogenicity within normal limits. No mass or hydronephrosis visualized. Left Kidney: Length: 9.1 cm. Echogenicity within normal limits. No mass or hydronephrosis visualized. Abdominal aorta: No aneurysm visualized. Other findings: None. IMPRESSION: 1. Negative for gallstones or biliary dilatation 2. Echogenic liver suggesting steatosis and or hepatocellular disease. Electronically Signed   By: Donavan Foil M.D.   On: 12/17/2018 17:30    Procedures Procedures (including critical care time)  Medications Ordered in ED Medications  oxyCODONE-acetaminophen (PERCOCET/ROXICET) 5-325 MG per tablet 1 tablet (1 tablet Oral Given 12/17/18 1440)  sodium chloride flush (NS) 0.9 % injection 3 mL (3 mLs Intravenous Given 12/17/18 1637)  sodium chloride 0.9 % bolus 500 mL (500 mLs Intravenous New Bag/Given 12/17/18 1637)  pantoprazole (PROTONIX) injection 40 mg (40 mg Intravenous Given 12/17/18 1639)  ondansetron (ZOFRAN) injection 4 mg (4 mg Intravenous Given 12/17/18 1638)     Initial Impression / Assessment and Plan / ED Course  I have reviewed the triage vital signs and the nursing notes.  Pertinent labs & imaging results that were available during my care of the  patient were reviewed by me and considered in my medical decision making (see chart for details).    Labs unremarkable.  Ultrasound negative.  Patient will be treated for GERD with Protonix along with some Zofran and Vicodin and follow-up with her PCP     Final Clinical Impressions(s) / ED Diagnoses   Final diagnoses:  Acute superficial gastritis without hemorrhage    ED Discharge Orders         Ordered    pantoprazole (PROTONIX) 20 MG tablet  Daily     12/17/18 1845    ondansetron (ZOFRAN ODT) 4 MG disintegrating tablet     12/17/18 1845    HYDROcodone-acetaminophen (NORCO/VICODIN) 5-325 MG tablet  Every 6 hours PRN     12/17/18 1845           Milton Ferguson, MD 12/17/18 1850

## 2018-12-29 MED FILL — ADDERALL XR 25 MG CAPSULE: 25 | 30 days supply | Qty: 30 | Fill #0

## 2019-01-27 MED FILL — ADDERALL XR 25 MG CAPSULE: 25 | 30 days supply | Qty: 30 | Fill #0

## 2019-02-20 DIAGNOSIS — K219 Gastro-esophageal reflux disease without esophagitis: Secondary | ICD-10-CM | POA: Diagnosis not present

## 2019-02-20 DIAGNOSIS — F900 Attention-deficit hyperactivity disorder, predominantly inattentive type: Secondary | ICD-10-CM | POA: Diagnosis not present

## 2019-02-20 DIAGNOSIS — R109 Unspecified abdominal pain: Secondary | ICD-10-CM | POA: Diagnosis not present

## 2019-02-24 MED FILL — ADDERALL XR 25 MG CAPSULE: 25 | 30 days supply | Qty: 30 | Fill #0

## 2019-03-27 MED FILL — ADDERALL XR 25 MG CAPSULE: 25 | 30 days supply | Qty: 30 | Fill #0

## 2019-04-23 DIAGNOSIS — E669 Obesity, unspecified: Secondary | ICD-10-CM | POA: Diagnosis not present

## 2019-04-23 DIAGNOSIS — F909 Attention-deficit hyperactivity disorder, unspecified type: Secondary | ICD-10-CM | POA: Diagnosis not present

## 2019-04-23 DIAGNOSIS — J329 Chronic sinusitis, unspecified: Secondary | ICD-10-CM | POA: Diagnosis not present

## 2019-04-23 MED FILL — METHYLPREDNISOLONE 4 MG TBP: 4 | 6 days supply | Qty: 21 | Fill #0

## 2019-04-23 MED FILL — ADDERALL XR 25 MG CAPSULE: 25 | 30 days supply | Qty: 30 | Fill #0

## 2019-04-28 ENCOUNTER — Encounter: Payer: Self-pay | Admitting: Physician Assistant

## 2019-05-07 ENCOUNTER — Other Ambulatory Visit: Payer: Self-pay

## 2019-05-07 ENCOUNTER — Encounter: Payer: Self-pay | Admitting: Physician Assistant

## 2019-05-07 ENCOUNTER — Ambulatory Visit: Payer: 59 | Admitting: Physician Assistant

## 2019-05-07 VITALS — BP 124/70 | HR 96 | Temp 98.1°F | Ht 65.25 in | Wt 202.1 lb

## 2019-05-07 DIAGNOSIS — R1013 Epigastric pain: Secondary | ICD-10-CM

## 2019-05-07 DIAGNOSIS — K219 Gastro-esophageal reflux disease without esophagitis: Secondary | ICD-10-CM

## 2019-05-07 DIAGNOSIS — K5900 Constipation, unspecified: Secondary | ICD-10-CM | POA: Diagnosis not present

## 2019-05-07 DIAGNOSIS — R1314 Dysphagia, pharyngoesophageal phase: Secondary | ICD-10-CM

## 2019-05-07 NOTE — Progress Notes (Signed)
Reviewed and agree with management plans. ? ?Senna Lape L. Indira Sorenson, MD, MPH  ?

## 2019-05-07 NOTE — Progress Notes (Signed)
Chief Complaint: GERD, Epigastric pain  HPI:    Rachael Goodman is a 54 year old African-American female with a past medical history as listed below, who was referred to me by Redmond School, MD for a complaint of GERD and epigastric pain.      10/25/2011 EGD and colonoscopy with Dr. Laural Golden.  EGD with focal erythema noted at the GE junction with no ring or stricture.  There was a lesion in the posterior wall of the gastric body thought to be an AV malformation, single lymphangiectasia in the second part of the duodenum.  Esophagus dilated by passing 54 and 56 [place] Maloney dilator.  Colonoscopy with excellent prep, small diverticulum next to the ileocecal valve, 2 small polyps in the splenic flexure and otherwise normal.  Pathology showed hyperplastic polyps.  Repeat recommended in 10 years.    12/17/2018 patient seen in the ER for abdominal pain.  CBC, urinalysis, lipase, CMP normal.  Abdominal ultrasound was negative for gallstones or biliary dilatation.  Echogenic liver suggesting steatosis and or hepatocellular disease.  She was treated for GERD with Protonix 20 mg daily.    Today, the patient tells me that she has been told she had a hiatal hernia in the past and she feels like this has gotten worse because she can feel it in her chest and describes a pain which happens at least 3 times a week, worsened by some things that she eats like spicy foods etc. but can also occur on its own.  Typically she can drink something that makes her burp and pain eases, but then there will be a constant ache which lasts for a few days.  This ache radiates up into her chest and into her jaw and occasionally makes her feel like she is having heart problems.  Rated as a 5-6/10.  She has been seen by cardiology and cardiac origin has been ruled out.      Tells me she also continues to difficulty swallowing breads, etc.  Reports being terrible at taking medicine and tells me that the PPIs had never helped her in the past  anyways, though tells me it was hard for her to remember to take even once a day.    Also complains of constipation with bowel movements every 3+ days, and has tried various things in the past to help but "again I am not good at medicine".    Specifically asked questions about a type of surgery that she could have to help with the symptoms so that she does not confuse them with cardiac things.    Denies fever, chills, weight loss or blood in her stool.     Past Medical History:  Diagnosis Date  . Hernia, hiatal     Past Surgical History:  Procedure Laterality Date  . ABDOMINAL HYSTERECTOMY  2012   panniculectomy  . BACK SURGERY    . BALLOON DILATION  10/25/2011   Procedure: BALLOON DILATION;  Surgeon: Rogene Houston, MD;  Location: [facility];  Service: Endoscopy;  Laterality: N/A;  . CLOSED REDUCTION FINGER WITH PERCUTANEOUS PINNING Right 09/07/2013   Procedure: CLOSED REDUCTION FINGER WITH PERCUTANEOUS PINNING    RIGHT RING FINGER MIDDLE PHALANX Application of external fixator;  Surgeon: Schuyler Amor, MD;  Location: [facility];  Service: Orthopedics;  Laterality: Right;  . EYE SURGERY Bilateral   . MALONEY DILATION  10/25/2011   Procedure: Venia Minks DILATION;  Surgeon: Rogene Houston, MD;  Location: [facility];  Service:  Endoscopy;  Laterality: N/A;  . SAVORY DILATION  10/25/2011   Procedure: SAVORY DILATION;  Surgeon: Rogene Houston, MD;  Location: [facility];  Service: Endoscopy;  Laterality: N/A;  . TONSILLECTOMY    . TUBAL LIGATION Bilateral     Current Outpatient Medications  Medication Sig Dispense Refill  . amphetamine-dextroamphetamine (ADDERALL XR) 25 MG 24 hr capsule Take 25 mg by mouth daily.    . pantoprazole (PROTONIX) 20 MG tablet Take 1 tablet (20 mg total) by mouth daily. 30 tablet 0  . sucralfate (CARAFATE) 1 g tablet Take 1 g by mouth 4 (four) times daily.     No current facility-administered medications for this visit.    Allergies  as of 05/07/2019  . (No Known Allergies)    Family History  Problem Relation Age of Onset  . Addison's disease Mother   . Diabetes Mother   . Hypertension Mother   . Hypertension Father   . Congestive Heart Failure Father   . Hypertension Brother   . Congestive Heart Failure Maternal Grandmother   . Congestive Heart Failure Maternal Grandfather     Social History   Socioeconomic History  . Marital status: Married    Spouse name: Not on file  . Number of children: Not on file  . Years of education: Not on file  . Highest education level: Not on file  Occupational History  . Not on file  Tobacco Use  . Smoking status: Current Every Day Smoker    Packs/day: 0.50    Years: 30.00    Pack years: 15.00    Types: Cigarettes  . Smokeless tobacco: Never Used  . Tobacco comment: 1 pack every two days  Substance and Sexual Activity  . Alcohol use: Yes    Comment: glass of wine rarely  . Drug use: No  . Sexual activity: Yes    Birth control/protection: Surgical    Comment: hyst  Other Topics Concern  . Not on file  Social History Narrative  . Not on file   Social Determinants of Health   Financial Resource Strain:   . Difficulty of Paying Living Expenses:   Food Insecurity:   . Worried About Charity fundraiser in the Last Year:   . Arboriculturist in the Last Year:   Transportation Needs:   . Film/video editor (Medical):   Marland Kitchen Lack of Transportation (Non-Medical):   Physical Activity:   . Days of Exercise per Week:   . Minutes of Exercise per Session:   Stress:   . Feeling of Stress :   Social Connections:   . Frequency of Communication with Friends and Family:   . Frequency of Social Gatherings with Friends and Family:   . Attends Religious Services:   . Active Member of Clubs or Organizations:   . Attends Archivist Meetings:   Marland Kitchen Marital Status:   Intimate Partner Violence:   . Fear of Current or Ex-Partner:   . Emotionally Abused:   Marland Kitchen  Physically Abused:   . Sexually Abused:     Review of Systems:    Constitutional: No weight loss, fever or chills Skin: No rash  Cardiovascular: No chest pain Respiratory: No SOB Gastrointestinal: See HPI and otherwise negative Genitourinary: No dysuria  Neurological: No headache Musculoskeletal: No new muscle or joint pain Hematologic: No bleeding  Psychiatric: No history of depression or anxiety   Physical Exam:  Vital signs: BP 124/70 (BP Location: Left Arm, Patient Position:  Sitting, Cuff Size: Normal)   Pulse 96   Temp 98.1 F (36.7 C)   Ht 5' 5.25" (1.657 m) Comment: height measured without shoes  Wt 202 lb 2 oz (91.7 kg)   BMI 33.38 kg/m   Constitutional:   Pleasant AA female appears to be in NAD, Well developed, Well nourished, alert and cooperative Head:  Normocephalic and atraumatic. Eyes:   PEERL, EOMI. No icterus. Conjunctiva pink. Ears:  Normal auditory acuity. Neck:  Supple Throat: Oral cavity and pharynx without inflammation, swelling or lesion.  Respiratory: Respirations even and unlabored. Lungs clear to auscultation bilaterally.   No wheezes, crackles, or rhonchi.  Cardiovascular: Normal S1, S2. No MRG. Regular rate and rhythm. No peripheral edema, cyanosis or pallor.  Gastrointestinal:  Soft, nondistended, mild epigastric ttp, No rebound or guarding. Normal bowel sounds. No appreciable masses or hepatomegaly. Rectal:  Not performed.  Msk:  Symmetrical without gross deformities. Without edema, no deformity or joint abnormality.  Neurologic:  Alert and  oriented x4;  grossly normal neurologically.  Skin:   Dry and intact without significant lesions or rashes. Psychiatric:Demonstrates good judgement and reason without abnormal affect or behaviors.  RELEVANT LABS AND IMAGING: CBC    Component Value Date/Time   WBC 7.5 12/17/2018 1636   RBC 5.22 (H) 12/17/2018 1636   HGB 14.7 12/17/2018 1636   HCT 47.4 (H) 12/17/2018 1636   PLT 333 12/17/2018 1636   MCV  90.8 12/17/2018 1636   MCH 28.2 12/17/2018 1636   MCHC 31.0 12/17/2018 1636   RDW 13.5 12/17/2018 1636   LYMPHSABS 3.1 05/19/2009 1905   MONOABS 0.6 05/19/2009 1905   EOSABS 0.2 05/19/2009 1905   BASOSABS 0.1 05/19/2009 1905    CMP     Component Value Date/Time   NA 139 12/17/2018 1636   K 3.8 12/17/2018 1636   CL 104 12/17/2018 1636   CO2 26 12/17/2018 1636   GLUCOSE 96 12/17/2018 1636   BUN 10 12/17/2018 1636   CREATININE 0.99 12/17/2018 1636   CALCIUM 9.0 12/17/2018 1636   PROT 7.8 12/17/2018 1636   ALBUMIN 4.4 12/17/2018 1636   AST 18 12/17/2018 1636   ALT 19 12/17/2018 1636   ALKPHOS 76 12/17/2018 1636   BILITOT 0.5 12/17/2018 1636   GFRNONAA >60 12/17/2018 1636   GFRAA >60 12/17/2018 1636    Assessment: 1.  Epigastric pain: With increase in reflux symptoms, previous history of multiple EGDs with dilations and gastritis 2.  GERD: Increase in symptoms over the past few months, currently not on any medication 3.  Constipation: Chronic for the patient, likely IBS-C 4.  Dysphagia: Likely related to stricture  Plan: 1.  Discussed medications with the patient.  She tells me that she is bad at remembering medicine.  Explained that we will start with an EGD to see what things look like.  Then we can better determine whether or not she definitely needs medicine. 2.  Scheduled patient for an EGD with dilation in the [place] with Dr. Tarri Glenn.  Did discuss risks, benefits, limitations and alternatives and patient agrees to proceed.  Patient has had both of her Covid vaccines. 3.  Discussed constipation briefly.  Encouraged fiber, water and exercise.  Also briefly discussed MiraLAX. 4.  Patient will need to follow in clinic after procedure for further recommendations for reflux.  We also briefly discussed a TIF procedure today, but will need EGD first to see if this would even be recommended for her. Also discussed that she would likely  need to max out oral therapy before planning on  surgery.  Ellouise Newer, PA-C [facility] 05/07/2019, 2:27 PM  Cc: Redmond School, MD

## 2019-05-07 NOTE — Patient Instructions (Signed)
If you are age 55 or older, your body mass index should be between 23-30. Your Body mass index is 33.38 kg/m. If this is out of the aforementioned range listed, please consider follow up with your Primary Care Provider.  If you are age 67 or younger, your body mass index should be between 19-25. Your Body mass index is 33.38 kg/m. If this is out of the aformentioned range listed, please consider follow up with your Primary Care Provider.   You have been scheduled for a colonoscopy. Please follow written instructions given to you at your visit today.  Please pick up your prep supplies at the pharmacy within the next 1-3 days. If you use inhalers (even only as needed), please bring them with you on the day of your procedure.   Follow up pending Endoscopy results.

## 2019-05-22 MED FILL — ADDERALL XR 25 MG CAPSULE: 25 | 30 days supply | Qty: 30 | Fill #0

## 2019-05-28 ENCOUNTER — Other Ambulatory Visit: Payer: Self-pay

## 2019-05-28 ENCOUNTER — Ambulatory Visit (AMBULATORY_SURGERY_CENTER): Payer: 59 | Admitting: Gastroenterology

## 2019-05-28 ENCOUNTER — Encounter: Payer: Self-pay | Admitting: Gastroenterology

## 2019-05-28 VITALS — BP 97/55 | HR 84 | Temp 98.0°F | Resp 17 | Ht 65.0 in | Wt 202.0 lb

## 2019-05-28 DIAGNOSIS — K228 Other specified diseases of esophagus: Secondary | ICD-10-CM

## 2019-05-28 DIAGNOSIS — K219 Gastro-esophageal reflux disease without esophagitis: Secondary | ICD-10-CM

## 2019-05-28 DIAGNOSIS — K449 Diaphragmatic hernia without obstruction or gangrene: Secondary | ICD-10-CM

## 2019-05-28 DIAGNOSIS — R1013 Epigastric pain: Secondary | ICD-10-CM

## 2019-05-28 DIAGNOSIS — R1310 Dysphagia, unspecified: Secondary | ICD-10-CM | POA: Diagnosis not present

## 2019-05-28 MED ORDER — SODIUM CHLORIDE 0.9 % IV SOLN: 500.0000 mL | Freq: Once | INTRAVENOUS | Status: DC

## 2019-05-28 MED ORDER — PANTOPRAZOLE SODIUM 40 MG PO TBEC: 40.0000 mg | DELAYED_RELEASE_TABLET | Freq: Two times a day (BID) | ORAL | 3 refills | Status: AC

## 2019-05-28 MED FILL — PANTOPRAZOLE SOD DR 40 MG T: 40 | 30 days supply | Qty: 60 | Fill #0

## 2019-05-28 NOTE — Progress Notes (Signed)
Pt's states no medical or surgical changes since previsit or office visit.  JB - temp DT - vitals 

## 2019-05-28 NOTE — Patient Instructions (Signed)
No aspirin, ibuprofen, naproxen, or other non-steriodal anti-inflammatory drugs.  Start pantoprazole 40mg  twice daily.  Handout provided on Gastritis.   YOU HAD AN ENDOSCOPIC PROCEDURE TODAY AT [facility]:   Refer to the procedure report that was given to you for any specific questions about what was found during the examination.  If the procedure report does not answer your questions, please call your gastroenterologist to clarify.  If you requested that your care partner not be given the details of your procedure findings, then the procedure report has been included in a sealed envelope for you to review at your convenience later.  YOU SHOULD EXPECT: Some feelings of bloating in the abdomen. Passage of more gas than usual.  Walking can help get rid of the air that was put into your GI tract during the procedure and reduce the bloating. If you had a lower endoscopy (such as a colonoscopy or flexible sigmoidoscopy) you may notice spotting of blood in your stool or on the toilet paper. If you underwent a bowel prep for your procedure, you may not have a normal bowel movement for a few days.  Please Note:  You might notice some irritation and congestion in your nose or some drainage.  This is from the oxygen used during your procedure.  There is no need for concern and it should clear up in a day or so.  SYMPTOMS TO REPORT IMMEDIATELY:    Following upper endoscopy (EGD)  Vomiting of blood or coffee ground material  New chest pain or pain under the shoulder blades  Painful or persistently difficult swallowing  New shortness of breath  Fever of 100F or higher  Black, tarry-looking stools  For urgent or emergent issues, a gastroenterologist can be reached at any hour by calling [phone]. Do not use MyChart messaging for urgent concerns.    DIET:  We do recommend a small meal at first, but then you may proceed to your regular diet.  Drink plenty of fluids but you should  avoid alcoholic beverages for 24 hours.  ACTIVITY:  You should plan to take it easy for the rest of today and you should NOT DRIVE or use heavy machinery until tomorrow (because of the sedation medicines used during the test).    FOLLOW UP: Our staff will call the number listed on your records 48-72 hours following your procedure to check on you and address any questions or concerns that you may have regarding the information given to you following your procedure. If we do not reach you, we will leave a message.  We will attempt to reach you two times.  During this call, we will ask if you have developed any symptoms of COVID 19. If you develop any symptoms (ie: fever, flu-like symptoms, shortness of breath, cough etc.) before then, please call [phone].  If you test positive for Covid 19 in the 2 weeks post procedure, please call and report this information to [place].    If any biopsies were taken you will be contacted by phone or by letter within the next 1-3 weeks.  Please call us at [phone] if you have not heard about the biopsies in 3 weeks.    SIGNATURES/CONFIDENTIALITY: You and/or your care partner have signed paperwork which will be entered into your electronic medical record.  These signatures attest to the fact that that the information above on your After Visit Summary has been reviewed and is understood.  Full responsibility of the confidentiality of  this discharge information lies with you and/or your care-partner.

## 2019-05-28 NOTE — Op Note (Addendum)
[facility] Patient Name: Rachael Goodman Procedure Date: 05/28/2019 2:46 PM MRN: [id number] Endoscopist: Thornton Park MD, MD Age: 54 Referring MD:  Date of Birth: 11/27/1965 Gender: Female Account #: [id number] Procedure:                Upper GI endoscopy Indications:              Epigastric abdominal pain, Dysphagia, Suspected                            gastro-esophageal reflux disease Medicines:                Monitored Anesthesia Care Procedure:                Pre-Anesthesia Assessment:                           - Prior to the procedure, a History and Physical                            was performed, and patient medications and                            allergies were reviewed. The patient's tolerance of                            previous anesthesia was also reviewed. The risks                            and benefits of the procedure and the sedation                            options and risks were discussed with the patient.                            All questions were answered, and informed consent                            was obtained. Prior Anticoagulants: The patient has                            taken no previous anticoagulant or antiplatelet                            agents. ASA Grade Assessment: II - A patient with                            mild systemic disease. After reviewing the risks                            and benefits, the patient was deemed in                            satisfactory condition to undergo the procedure.  After obtaining informed consent, the endoscope was                            passed under direct vision. Throughout the                            procedure, the patient's blood pressure, pulse, and                            oxygen saturations were monitored continuously. The                            Endoscope was introduced through the mouth, and                            advanced to the third  part of duodenum. The upper                            GI endoscopy was accomplished without difficulty.                            The patient tolerated the procedure well. Scope In: Scope Out: Findings:                 The examined esophagus was normal. Biopsies were                            obtained from the proximal and distal esophagus                            with cold forceps for histology of suspected                            eosinophilic esophagitis and reflux. No ring, web,                            or stricture.                           A focal area of discoloration was found on the                            lesser curvature of the stomach. Biopsies were                            taken with a cold forceps for histology. Estimated                            blood loss was minimal.                           Diffuse mild inflammation characterized by                            erythema, friability and granularity  was found in                            the gastric body and in the gastric antrum.                            Biopsies were taken with a cold forceps for                            histology. Estimated blood loss was minimal.                           The examined duodenum was normal.                           A small hiatal hernia is present. The cardia and                            gastric fundus were normal on retroflexion.                           The exam was otherwise without abnormality. Complications:            No immediate complications. Estimated blood loss:                            Minimal. Estimated Blood Loss:     Estimated blood loss was minimal. Impression:               - Normal esophagus. Biopsied.                           - Focal area of discolored mucosa in the lesser                            curvature. This is of unclear clinical significance                            Biopsied.                           - Gastritis. Biopsied.                            - Small hiatal hernia.                           - Normal examined duodenum.                           - The examination was otherwise normal. Recommendation:           - Patient has a contact number available for                            emergencies. The signs and symptoms of potential  delayed complications were discussed with the                            patient. Return to normal activities tomorrow.                            Written discharge instructions were provided to the                            patient.                           - Resume previous diet.                           - Continue present medications. Start pantoprazole                            40 mg BID.                           - Await pathology results.                           - No aspirin, ibuprofen, naproxen, or other                            non-steroidal anti-inflammatory drugs.                           - Follow-up in the office to review these results                            with Dr. Tarri Glenn or Ellouise Newer. Thornton Park MD, MD 05/28/2019 3:16:46 PM This report has been signed electronically.

## 2019-05-28 NOTE — Progress Notes (Signed)
Called to room to assist during endoscopic procedure.  Patient ID and intended procedure confirmed with present staff. Received instructions for my participation in the procedure from the performing physician.  

## 2019-06-01 ENCOUNTER — Telehealth: Payer: Self-pay

## 2019-06-01 NOTE — Telephone Encounter (Signed)
  Follow up Call-  Call back number 05/28/2019  Post procedure Call Back phone  # [phone]  Permission to leave phone message Yes  Some recent data might be hidden     Patient questions:  Do you have a fever, pain , or abdominal swelling? No. Pain Score  0 *  Have you tolerated food without any problems? Yes.    Have you been able to return to your normal activities? Yes.    Do you have any questions about your discharge instructions: Diet   No. Medications  No. Follow up visit  No.  Do you have questions or concerns about your Care? No.  Actions: * If pain score is 4 or above: No action needed, pain <4. 1. Have you developed a fever since your procedure? no  2.   Have you had an respiratory symptoms (SOB or cough) since your procedure? no  3.   Have you tested positive for COVID 19 since your procedure no  4.   Have you had any family members/close contacts diagnosed with the COVID 19 since your procedure?  no   If yes to any of these questions please route to Joylene John, RN and Erenest Rasher, RN

## 2019-06-02 ENCOUNTER — Encounter: Payer: Self-pay | Admitting: Gastroenterology

## 2019-07-02 ENCOUNTER — Ambulatory Visit: Payer: 59 | Admitting: Gastroenterology

## 2019-07-07 MED FILL — PROMETHAZINE 25 MG TABLET: 25 | 2 days supply | Qty: 10 | Fill #0

## 2019-07-07 MED FILL — HYDROCODON-APAP 7.5-325: 7.5-325 | 5 days supply | Qty: 20 | Fill #0

## 2019-07-23 DIAGNOSIS — F909 Attention-deficit hyperactivity disorder, unspecified type: Secondary | ICD-10-CM | POA: Diagnosis not present

## 2019-08-21 MED FILL — ADDERALL XR 25 MG CAPSULE: 25 | 30 days supply | Qty: 30 | Fill #0

## 2019-09-25 MED FILL — ADDERALL XR 25 MG CAPSULE: 25 | 30 days supply | Qty: 30 | Fill #0

## 2019-10-27 MED FILL — ADDERALL XR 25 MG CAPSULE: 25 | 30 days supply | Qty: 30 | Fill #0

## 2019-10-29 ENCOUNTER — Other Ambulatory Visit (HOSPITAL_COMMUNITY): Payer: Self-pay | Admitting: Internal Medicine

## 2019-10-29 DIAGNOSIS — Z23 Encounter for immunization: Secondary | ICD-10-CM | POA: Diagnosis not present

## 2019-10-29 DIAGNOSIS — Z1389 Encounter for screening for other disorder: Secondary | ICD-10-CM | POA: Diagnosis not present

## 2019-10-29 DIAGNOSIS — K449 Diaphragmatic hernia without obstruction or gangrene: Secondary | ICD-10-CM | POA: Diagnosis not present

## 2019-10-29 DIAGNOSIS — K219 Gastro-esophageal reflux disease without esophagitis: Secondary | ICD-10-CM | POA: Diagnosis not present

## 2019-10-29 DIAGNOSIS — F909 Attention-deficit hyperactivity disorder, unspecified type: Secondary | ICD-10-CM | POA: Diagnosis not present

## 2019-10-29 DIAGNOSIS — Z6830 Body mass index (BMI) 30.0-30.9, adult: Secondary | ICD-10-CM | POA: Diagnosis not present

## 2019-10-29 MED FILL — PANTOPRAZOLE SOD DR 40 MG T: 40 | 90 days supply | Qty: 180 | Fill #0

## 2019-11-30 MED FILL — ADDERALL XR 25 MG CAPSULE: 25 | 30 days supply | Qty: 30 | Fill #0

## 2019-11-30 MED FILL — PANTOPRAZOLE SOD DR 40 MG T: 40 | 90 days supply | Qty: 180 | Fill #0

## 2019-12-28 MED FILL — ADDERALL XR 25 MG CAPSULE: 25 | 30 days supply | Qty: 30 | Fill #0

## 2020-01-26 ENCOUNTER — Other Ambulatory Visit (HOSPITAL_COMMUNITY): Payer: Self-pay | Admitting: Physician Assistant

## 2020-01-26 DIAGNOSIS — F909 Attention-deficit hyperactivity disorder, unspecified type: Secondary | ICD-10-CM | POA: Diagnosis not present

## 2020-01-26 MED FILL — ADDERALL XR 25 MG CAPSULE: 25 | 30 days supply | Qty: 30 | Fill #0

## 2020-02-22 ENCOUNTER — Other Ambulatory Visit (HOSPITAL_COMMUNITY): Payer: Self-pay | Admitting: Internal Medicine

## 2020-02-22 DIAGNOSIS — Z1231 Encounter for screening mammogram for malignant neoplasm of breast: Secondary | ICD-10-CM

## 2020-02-25 ENCOUNTER — Ambulatory Visit (HOSPITAL_COMMUNITY)
Admission: RE | Admit: 2020-02-25 | Discharge: 2020-02-25 | Disposition: A | Discharge status: Home / Self Care | Payer: 59 | Source: Ambulatory Visit | Attending: Internal Medicine | Admitting: Internal Medicine

## 2020-02-25 ENCOUNTER — Other Ambulatory Visit: Payer: Self-pay

## 2020-02-25 DIAGNOSIS — Z1231 Encounter for screening mammogram for malignant neoplasm of breast: Secondary | ICD-10-CM | POA: Insufficient documentation

## 2020-02-27 MED FILL — ADDERALL XR 25 MG CAPSULE: 25 | 30 days supply | Qty: 30 | Fill #0

## 2020-02-29 ENCOUNTER — Other Ambulatory Visit (HOSPITAL_COMMUNITY): Payer: Self-pay | Admitting: Internal Medicine

## 2020-02-29 DIAGNOSIS — R928 Other abnormal and inconclusive findings on diagnostic imaging of breast: Secondary | ICD-10-CM

## 2020-03-03 ENCOUNTER — Other Ambulatory Visit (HOSPITAL_COMMUNITY): Payer: Self-pay | Admitting: Oral Surgery

## 2020-03-03 MED FILL — AMOXICILLIN 500 MG CAPSULE: 500 | 7 days supply | Qty: 21 | Fill #0

## 2020-03-08 ENCOUNTER — Encounter (HOSPITAL_COMMUNITY): Payer: Self-pay

## 2020-03-08 ENCOUNTER — Ambulatory Visit (HOSPITAL_COMMUNITY): Payer: 59

## 2020-03-15 ENCOUNTER — Encounter (HOSPITAL_COMMUNITY): Payer: 59

## 2020-03-15 ENCOUNTER — Ambulatory Visit (HOSPITAL_COMMUNITY): Payer: 59

## 2020-03-24 DIAGNOSIS — L821 Other seborrheic keratosis: Secondary | ICD-10-CM | POA: Diagnosis not present

## 2020-03-24 DIAGNOSIS — D485 Neoplasm of uncertain behavior of skin: Secondary | ICD-10-CM | POA: Diagnosis not present

## 2020-03-24 DIAGNOSIS — H02834 Dermatochalasis of left upper eyelid: Secondary | ICD-10-CM | POA: Diagnosis not present

## 2020-03-24 DIAGNOSIS — H02831 Dermatochalasis of right upper eyelid: Secondary | ICD-10-CM | POA: Diagnosis not present

## 2020-03-29 ENCOUNTER — Ambulatory Visit (HOSPITAL_COMMUNITY)
Admission: RE | Admit: 2020-03-29 | Discharge: 2020-03-29 | Disposition: A | Discharge status: Home / Self Care | Payer: 59 | Source: Ambulatory Visit | Attending: Internal Medicine | Admitting: Internal Medicine

## 2020-03-29 ENCOUNTER — Encounter (HOSPITAL_COMMUNITY): Payer: Self-pay

## 2020-03-29 ENCOUNTER — Other Ambulatory Visit (HOSPITAL_COMMUNITY): Payer: 59

## 2020-03-29 ENCOUNTER — Other Ambulatory Visit: Payer: Self-pay

## 2020-03-29 DIAGNOSIS — R928 Other abnormal and inconclusive findings on diagnostic imaging of breast: Secondary | ICD-10-CM | POA: Diagnosis not present

## 2020-03-29 DIAGNOSIS — R922 Inconclusive mammogram: Secondary | ICD-10-CM | POA: Diagnosis not present

## 2020-03-30 MED FILL — ADDERALL XR 25 MG CAPSULE: 25 | 30 days supply | Qty: 30 | Fill #0

## 2020-04-20 ENCOUNTER — Other Ambulatory Visit (HOSPITAL_COMMUNITY): Payer: Self-pay | Admitting: Physician Assistant

## 2020-04-20 MED FILL — diazePAM 5 MG TABS: 5 | 1 days supply | Qty: 2 | Fill #0

## 2020-04-20 MED FILL — NEO/POLY/DEXAMET EYE OINT: 3.5-10000-0 | 3 days supply | Qty: 4 | Fill #0

## 2020-04-20 MED FILL — LORAZEPAM 1 MG TABS: 1 | 1 days supply | Qty: 2 | Fill #0

## 2020-04-28 DIAGNOSIS — D485 Neoplasm of uncertain behavior of skin: Secondary | ICD-10-CM | POA: Diagnosis not present

## 2020-04-28 DIAGNOSIS — D23121 Other benign neoplasm of skin of left upper eyelid, including canthus: Secondary | ICD-10-CM | POA: Diagnosis not present

## 2020-04-29 ENCOUNTER — Other Ambulatory Visit (HOSPITAL_COMMUNITY): Payer: Self-pay | Admitting: Internal Medicine

## 2020-05-03 DIAGNOSIS — F909 Attention-deficit hyperactivity disorder, unspecified type: Secondary | ICD-10-CM | POA: Diagnosis not present

## 2020-05-04 ENCOUNTER — Other Ambulatory Visit (HOSPITAL_COMMUNITY): Payer: Self-pay | Admitting: Internal Medicine

## 2020-05-12 DIAGNOSIS — H5212 Myopia, left eye: Secondary | ICD-10-CM | POA: Diagnosis not present

## 2020-05-12 DIAGNOSIS — L821 Other seborrheic keratosis: Secondary | ICD-10-CM | POA: Diagnosis not present

## 2020-05-12 DIAGNOSIS — D210 Benign neoplasm of connective and other soft tissue of head, face and neck: Secondary | ICD-10-CM | POA: Diagnosis not present

## 2020-05-12 DIAGNOSIS — H02824 Cysts of left upper eyelid: Secondary | ICD-10-CM | POA: Diagnosis not present

## 2020-05-12 DIAGNOSIS — H02831 Dermatochalasis of right upper eyelid: Secondary | ICD-10-CM | POA: Diagnosis not present

## 2020-05-12 DIAGNOSIS — H524 Presbyopia: Secondary | ICD-10-CM | POA: Diagnosis not present

## 2020-05-12 DIAGNOSIS — H02834 Dermatochalasis of left upper eyelid: Secondary | ICD-10-CM | POA: Diagnosis not present

## 2020-05-31 ENCOUNTER — Other Ambulatory Visit (HOSPITAL_COMMUNITY): Payer: Self-pay

## 2020-05-31 MED FILL — Amphetamine-Dextroamphetamine Cap ER 24HR 25 MG: ORAL | 30 days supply | Qty: 30 | Fill #0 | Status: AC

## 2020-06-30 ENCOUNTER — Other Ambulatory Visit (HOSPITAL_COMMUNITY): Payer: Self-pay

## 2020-06-30 MED FILL — Amphetamine-Dextroamphetamine Cap ER 24HR 25 MG: ORAL | 30 days supply | Qty: 30 | Fill #0 | Status: AC

## 2020-07-20 ENCOUNTER — Other Ambulatory Visit (HOSPITAL_COMMUNITY): Payer: Self-pay

## 2020-07-20 MED ORDER — AMOXICILLIN 500 MG PO CAPS
500.0000 mg | ORAL_CAPSULE | Freq: Two times a day (BID) | ORAL | 0 refills | Status: AC
  Filled 2020-07-20: qty 14, 7d supply, fill #0

## 2020-08-01 ENCOUNTER — Other Ambulatory Visit (HOSPITAL_COMMUNITY): Payer: Self-pay

## 2020-08-01 MED FILL — Amphetamine-Dextroamphetamine Cap ER 24HR 25 MG: ORAL | 30 days supply | Qty: 30 | Fill #0 | Status: AC

## 2020-09-02 ENCOUNTER — Other Ambulatory Visit (HOSPITAL_COMMUNITY): Payer: Self-pay

## 2020-09-02 DIAGNOSIS — F909 Attention-deficit hyperactivity disorder, unspecified type: Secondary | ICD-10-CM | POA: Diagnosis not present

## 2020-09-02 DIAGNOSIS — E663 Overweight: Secondary | ICD-10-CM | POA: Diagnosis not present

## 2020-09-02 DIAGNOSIS — K219 Gastro-esophageal reflux disease without esophagitis: Secondary | ICD-10-CM | POA: Diagnosis not present

## 2020-09-02 DIAGNOSIS — Z6829 Body mass index (BMI) 29.0-29.9, adult: Secondary | ICD-10-CM | POA: Diagnosis not present

## 2020-09-02 MED ORDER — PANTOPRAZOLE SODIUM 40 MG PO TBEC
DELAYED_RELEASE_TABLET | ORAL | 3 refills | Status: AC
  Filled 2020-09-09: qty 180, 90d supply, fill #0

## 2020-09-02 MED ORDER — AMPHETAMINE-DEXTROAMPHET ER 25 MG PO CP24
ORAL_CAPSULE | ORAL | 0 refills | Status: AC
  Filled 2020-09-03: qty 30, 30d supply, fill #0

## 2020-09-02 MED ORDER — SUCRALFATE 1 GM/10ML PO SUSP
ORAL | 3 refills | Status: DC
  Filled 2020-09-02: qty 900, 90d supply, fill #0

## 2020-09-02 MED ORDER — AMPHETAMINE-DEXTROAMPHET ER 25 MG PO CP24
ORAL_CAPSULE | ORAL | 0 refills | Status: DC
  Filled 2020-10-29: qty 30, 30d supply, fill #0

## 2020-09-02 MED ORDER — AMPHETAMINE-DEXTROAMPHET ER 25 MG PO CP24
ORAL_CAPSULE | ORAL | 0 refills | Status: AC
  Filled 2020-09-30: qty 30, 30d supply, fill #0
  Filled ????-??-??: fill #0

## 2020-09-03 ENCOUNTER — Other Ambulatory Visit (HOSPITAL_COMMUNITY): Payer: Self-pay

## 2020-09-06 ENCOUNTER — Other Ambulatory Visit (HOSPITAL_COMMUNITY): Payer: Self-pay

## 2020-09-07 ENCOUNTER — Other Ambulatory Visit (HOSPITAL_COMMUNITY): Payer: Self-pay

## 2020-09-09 ENCOUNTER — Other Ambulatory Visit (HOSPITAL_COMMUNITY): Payer: Self-pay

## 2020-09-12 ENCOUNTER — Other Ambulatory Visit (HOSPITAL_COMMUNITY): Payer: Self-pay

## 2020-09-14 ENCOUNTER — Other Ambulatory Visit (HOSPITAL_COMMUNITY): Payer: Self-pay

## 2020-09-15 ENCOUNTER — Other Ambulatory Visit (HOSPITAL_COMMUNITY): Payer: Self-pay

## 2020-09-16 ENCOUNTER — Other Ambulatory Visit (HOSPITAL_COMMUNITY): Payer: Self-pay

## 2020-09-17 ENCOUNTER — Other Ambulatory Visit (HOSPITAL_COMMUNITY): Payer: Self-pay

## 2020-09-19 ENCOUNTER — Other Ambulatory Visit (HOSPITAL_COMMUNITY): Payer: Self-pay

## 2020-09-19 MED ORDER — SUCRALFATE 1 GM/10ML PO SUSP
ORAL | 3 refills | Status: DC
  Filled 2020-09-19: qty 900, 90d supply, fill #0

## 2020-09-19 MED ORDER — SUCRALFATE 1 G PO TABS
1.0000 g | ORAL_TABLET | Freq: Every evening | ORAL | 3 refills | Status: AC | PRN
  Filled 2020-09-19 – 2020-09-29 (×2): qty 90, 90d supply, fill #0

## 2020-09-27 ENCOUNTER — Other Ambulatory Visit (HOSPITAL_COMMUNITY): Payer: Self-pay

## 2020-09-29 ENCOUNTER — Other Ambulatory Visit (HOSPITAL_COMMUNITY): Payer: Self-pay

## 2020-09-30 ENCOUNTER — Other Ambulatory Visit (HOSPITAL_COMMUNITY): Payer: Self-pay

## 2020-10-29 ENCOUNTER — Other Ambulatory Visit (HOSPITAL_COMMUNITY): Payer: Self-pay

## 2020-11-30 ENCOUNTER — Other Ambulatory Visit (HOSPITAL_COMMUNITY): Payer: Self-pay

## 2020-12-01 ENCOUNTER — Other Ambulatory Visit (HOSPITAL_COMMUNITY): Payer: Self-pay

## 2020-12-01 MED ORDER — AMPHETAMINE-DEXTROAMPHET ER 25 MG PO CP24
ORAL_CAPSULE | Freq: Every day | ORAL | 0 refills | Status: DC
  Filled 2020-12-28: qty 30, 30d supply, fill #0

## 2020-12-01 MED ORDER — AMPHETAMINE-DEXTROAMPHET ER 25 MG PO CP24
ORAL_CAPSULE | ORAL | 0 refills | Status: AC
  Filled 2020-12-01: qty 30, 30d supply, fill #0

## 2020-12-28 ENCOUNTER — Other Ambulatory Visit (HOSPITAL_COMMUNITY): Payer: Self-pay

## 2020-12-28 MED ORDER — CHLORHEXIDINE GLUCONATE 0.12 % MT SOLN
OROMUCOSAL | 99 refills | Status: AC
  Filled 2020-12-28: qty 473, 15d supply, fill #0

## 2020-12-28 MED ORDER — CLINDAMYCIN HCL 150 MG PO CAPS
150.0000 mg | ORAL_CAPSULE | Freq: Four times a day (QID) | ORAL | 0 refills | Status: AC
  Filled 2020-12-28: qty 30, 8d supply, fill #0

## 2021-01-11 ENCOUNTER — Other Ambulatory Visit (HOSPITAL_COMMUNITY): Payer: Self-pay

## 2021-01-11 MED ORDER — AMPHETAMINE-DEXTROAMPHET ER 25 MG PO CP24
25.0000 mg | ORAL_CAPSULE | Freq: Every day | ORAL | 0 refills | Status: AC
  Filled 2021-02-27: qty 30, 30d supply, fill #0

## 2021-01-21 ENCOUNTER — Other Ambulatory Visit (HOSPITAL_COMMUNITY): Payer: Self-pay

## 2021-01-21 MED ORDER — AMOXICILLIN 500 MG PO CAPS
500.0000 mg | ORAL_CAPSULE | Freq: Three times a day (TID) | ORAL | 0 refills | Status: AC
  Filled 2021-01-21: qty 21, 7d supply, fill #0

## 2021-01-23 ENCOUNTER — Other Ambulatory Visit (HOSPITAL_COMMUNITY): Payer: Self-pay

## 2021-01-23 DIAGNOSIS — K219 Gastro-esophageal reflux disease without esophagitis: Secondary | ICD-10-CM | POA: Diagnosis not present

## 2021-01-23 DIAGNOSIS — R928 Other abnormal and inconclusive findings on diagnostic imaging of breast: Secondary | ICD-10-CM | POA: Diagnosis not present

## 2021-01-23 DIAGNOSIS — F988 Other specified behavioral and emotional disorders with onset usually occurring in childhood and adolescence: Secondary | ICD-10-CM | POA: Diagnosis not present

## 2021-01-23 MED ORDER — AMPHETAMINE-DEXTROAMPHET ER 25 MG PO CP24
ORAL_CAPSULE | ORAL | 0 refills | Status: DC
  Filled 2021-05-03: qty 30, 30d supply, fill #0

## 2021-01-23 MED ORDER — AMPHETAMINE-DEXTROAMPHET ER 25 MG PO CP24
ORAL_CAPSULE | ORAL | 0 refills | Status: AC
  Filled 2021-01-23: qty 30, 30d supply, fill #0

## 2021-01-23 MED ORDER — AMPHETAMINE-DEXTROAMPHET ER 25 MG PO CP24
ORAL_CAPSULE | ORAL | 0 refills | Status: AC
  Filled 2021-04-01: qty 30, 30d supply, fill #0

## 2021-01-30 ENCOUNTER — Other Ambulatory Visit (HOSPITAL_COMMUNITY): Payer: Self-pay | Admitting: Internal Medicine

## 2021-01-30 ENCOUNTER — Other Ambulatory Visit (HOSPITAL_COMMUNITY): Payer: Self-pay

## 2021-01-30 DIAGNOSIS — R921 Mammographic calcification found on diagnostic imaging of breast: Secondary | ICD-10-CM

## 2021-02-07 ENCOUNTER — Other Ambulatory Visit (HOSPITAL_COMMUNITY): Payer: Self-pay

## 2021-02-07 MED ORDER — PROMETHAZINE HCL 25 MG PO TABS
ORAL_TABLET | ORAL | 0 refills | Status: AC
  Filled 2021-02-07: qty 10, 3d supply, fill #0

## 2021-02-07 MED ORDER — TRAMADOL HCL 50 MG PO TABS
ORAL_TABLET | ORAL | 0 refills | Status: AC
  Filled 2021-02-07: qty 18, 7d supply, fill #0

## 2021-02-07 MED ORDER — CEPHALEXIN 500 MG PO CAPS
ORAL_CAPSULE | ORAL | 0 refills | Status: AC
  Filled 2021-02-07: qty 21, 5d supply, fill #0

## 2021-02-27 ENCOUNTER — Other Ambulatory Visit (HOSPITAL_COMMUNITY): Payer: Self-pay

## 2021-03-02 ENCOUNTER — Other Ambulatory Visit: Payer: Self-pay

## 2021-03-02 ENCOUNTER — Ambulatory Visit (HOSPITAL_COMMUNITY)
Admission: RE | Admit: 2021-03-02 | Discharge: 2021-03-02 | Disposition: A | Discharge status: Home / Self Care | Payer: 59 | Source: Ambulatory Visit | Attending: Internal Medicine | Admitting: Internal Medicine

## 2021-03-02 DIAGNOSIS — R921 Mammographic calcification found on diagnostic imaging of breast: Secondary | ICD-10-CM

## 2021-03-02 DIAGNOSIS — R922 Inconclusive mammogram: Secondary | ICD-10-CM | POA: Diagnosis not present

## 2021-03-07 ENCOUNTER — Encounter (HOSPITAL_COMMUNITY): Payer: 59

## 2021-03-07 ENCOUNTER — Other Ambulatory Visit (HOSPITAL_COMMUNITY): Payer: 59

## 2021-03-07 ENCOUNTER — Ambulatory Visit (HOSPITAL_COMMUNITY): Payer: 59

## 2021-04-01 ENCOUNTER — Other Ambulatory Visit (HOSPITAL_COMMUNITY): Payer: Self-pay

## 2021-04-28 ENCOUNTER — Other Ambulatory Visit (HOSPITAL_COMMUNITY): Payer: Self-pay

## 2021-04-28 MED ORDER — AMOXICILLIN 500 MG PO CAPS
500.0000 mg | ORAL_CAPSULE | Freq: Three times a day (TID) | ORAL | 0 refills | Status: AC
  Filled 2021-04-28: qty 21, 7d supply, fill #0

## 2021-05-03 ENCOUNTER — Other Ambulatory Visit (HOSPITAL_COMMUNITY): Payer: Self-pay

## 2021-05-11 ENCOUNTER — Other Ambulatory Visit (HOSPITAL_COMMUNITY): Payer: Self-pay

## 2021-05-11 MED ORDER — AMPHETAMINE-DEXTROAMPHET ER 25 MG PO CP24
ORAL_CAPSULE | ORAL | 0 refills | Status: DC
  Filled 2021-06-05: qty 30, 30d supply, fill #0

## 2021-06-05 ENCOUNTER — Other Ambulatory Visit (HOSPITAL_COMMUNITY): Payer: Self-pay

## 2021-07-06 ENCOUNTER — Other Ambulatory Visit (HOSPITAL_COMMUNITY): Payer: Self-pay

## 2021-07-06 MED ORDER — AMPHETAMINE-DEXTROAMPHET ER 25 MG PO CP24
ORAL_CAPSULE | ORAL | 0 refills | Status: DC
  Filled 2021-07-06: qty 30, 30d supply, fill #0

## 2021-07-28 ENCOUNTER — Other Ambulatory Visit (HOSPITAL_COMMUNITY): Payer: Self-pay

## 2021-07-28 DIAGNOSIS — Z23 Encounter for immunization: Secondary | ICD-10-CM | POA: Diagnosis not present

## 2021-07-28 DIAGNOSIS — K219 Gastro-esophageal reflux disease without esophagitis: Secondary | ICD-10-CM | POA: Diagnosis not present

## 2021-07-28 DIAGNOSIS — Z6830 Body mass index (BMI) 30.0-30.9, adult: Secondary | ICD-10-CM | POA: Diagnosis not present

## 2021-07-28 DIAGNOSIS — F988 Other specified behavioral and emotional disorders with onset usually occurring in childhood and adolescence: Secondary | ICD-10-CM | POA: Diagnosis not present

## 2021-07-28 DIAGNOSIS — Z0001 Encounter for general adult medical examination with abnormal findings: Secondary | ICD-10-CM | POA: Diagnosis not present

## 2021-07-28 DIAGNOSIS — Z1331 Encounter for screening for depression: Secondary | ICD-10-CM | POA: Diagnosis not present

## 2021-07-28 DIAGNOSIS — E6609 Other obesity due to excess calories: Secondary | ICD-10-CM | POA: Diagnosis not present

## 2021-07-28 MED ORDER — DIAZEPAM 2 MG PO TABS
ORAL_TABLET | ORAL | 4 refills | Status: AC
  Filled 2021-07-28: qty 30, 30d supply, fill #0

## 2021-07-28 MED ORDER — AMPHETAMINE-DEXTROAMPHET ER 25 MG PO CP24
ORAL_CAPSULE | ORAL | 0 refills | Status: DC
  Filled 2021-08-07: qty 30, 30d supply, fill #0

## 2021-07-29 ENCOUNTER — Other Ambulatory Visit (HOSPITAL_COMMUNITY): Payer: Self-pay

## 2021-07-29 MED ORDER — VITAMIN D (ERGOCALCIFEROL) 1.25 MG (50000 UNIT) PO CAPS
ORAL_CAPSULE | ORAL | 0 refills | Status: AC
  Filled 2021-07-29: qty 12, 84d supply, fill #0

## 2021-08-07 ENCOUNTER — Other Ambulatory Visit (HOSPITAL_COMMUNITY): Payer: Self-pay

## 2021-09-05 ENCOUNTER — Other Ambulatory Visit (HOSPITAL_COMMUNITY): Payer: Self-pay

## 2021-09-05 MED ORDER — AMPHETAMINE-DEXTROAMPHET ER 25 MG PO CP24
ORAL_CAPSULE | Freq: Every day | ORAL | 0 refills | Status: DC
  Filled 2021-09-05: qty 30, 30d supply, fill #0

## 2021-09-06 ENCOUNTER — Other Ambulatory Visit (HOSPITAL_COMMUNITY): Payer: Self-pay

## 2021-09-06 MED ORDER — AMPHETAMINE-DEXTROAMPHET ER 25 MG PO CP24
ORAL_CAPSULE | Freq: Every day | ORAL | 0 refills | Status: DC
  Filled 2021-09-06 – 2021-10-06 (×2): qty 30, 30d supply, fill #0

## 2021-09-12 ENCOUNTER — Other Ambulatory Visit (HOSPITAL_COMMUNITY): Payer: Self-pay

## 2021-09-12 DIAGNOSIS — F988 Other specified behavioral and emotional disorders with onset usually occurring in childhood and adolescence: Secondary | ICD-10-CM | POA: Diagnosis not present

## 2021-09-12 DIAGNOSIS — K047 Periapical abscess without sinus: Secondary | ICD-10-CM | POA: Diagnosis not present

## 2021-09-12 MED ORDER — PENICILLIN V POTASSIUM 500 MG PO TABS
500.0000 mg | ORAL_TABLET | Freq: Four times a day (QID) | ORAL | 1 refills | Status: AC
  Filled 2021-09-12: qty 40, 10d supply, fill #0

## 2021-09-17 DIAGNOSIS — S62309A Unspecified fracture of unspecified metacarpal bone, initial encounter for closed fracture: Secondary | ICD-10-CM | POA: Diagnosis not present

## 2021-09-17 DIAGNOSIS — S62306A Unspecified fracture of fifth metacarpal bone, right hand, initial encounter for closed fracture: Secondary | ICD-10-CM | POA: Diagnosis not present

## 2021-09-17 DIAGNOSIS — F17200 Nicotine dependence, unspecified, uncomplicated: Secondary | ICD-10-CM | POA: Diagnosis not present

## 2021-09-17 DIAGNOSIS — W231XXA Caught, crushed, jammed, or pinched between stationary objects, initial encounter: Secondary | ICD-10-CM | POA: Diagnosis not present

## 2021-09-21 ENCOUNTER — Other Ambulatory Visit (HOSPITAL_COMMUNITY): Payer: Self-pay

## 2021-10-06 ENCOUNTER — Other Ambulatory Visit (HOSPITAL_COMMUNITY): Payer: Self-pay

## 2021-10-06 ENCOUNTER — Encounter (INDEPENDENT_AMBULATORY_CARE_PROVIDER_SITE_OTHER): Payer: Self-pay | Admitting: *Deleted

## 2021-11-10 ENCOUNTER — Other Ambulatory Visit (HOSPITAL_COMMUNITY): Payer: Self-pay

## 2021-11-10 DIAGNOSIS — F988 Other specified behavioral and emotional disorders with onset usually occurring in childhood and adolescence: Secondary | ICD-10-CM | POA: Diagnosis not present

## 2021-11-10 MED ORDER — AMPHETAMINE-DEXTROAMPHET ER 25 MG PO CP24
25.0000 mg | ORAL_CAPSULE | Freq: Every day | ORAL | 0 refills | Status: AC
  Filled 2021-11-10 – 2022-01-10 (×2): qty 30, 30d supply, fill #0

## 2021-11-10 MED ORDER — DIAZEPAM 2 MG PO TABS
2.0000 mg | ORAL_TABLET | Freq: Every evening | ORAL | 4 refills | Status: AC | PRN
  Filled 2021-11-10 – 2022-02-14 (×3): qty 30, 30d supply, fill #0

## 2021-11-10 MED ORDER — AMPHETAMINE-DEXTROAMPHET ER 25 MG PO CP24
25.0000 mg | ORAL_CAPSULE | Freq: Every day | ORAL | 0 refills | Status: AC
  Filled 2021-11-10: qty 30, 30d supply, fill #0

## 2021-11-10 MED ORDER — AMPHETAMINE-DEXTROAMPHET ER 25 MG PO CP24
25.0000 mg | ORAL_CAPSULE | Freq: Every day | ORAL | 0 refills | Status: AC
  Filled 2022-03-16: qty 30, 30d supply, fill #0

## 2021-11-10 MED ORDER — AMPHETAMINE-DEXTROAMPHET ER 25 MG PO CP24
25.0000 mg | ORAL_CAPSULE | Freq: Every day | ORAL | 0 refills | Status: AC
  Filled 2021-12-11: qty 30, 30d supply, fill #0

## 2021-12-01 ENCOUNTER — Other Ambulatory Visit (HOSPITAL_COMMUNITY): Payer: Self-pay

## 2021-12-02 ENCOUNTER — Other Ambulatory Visit (HOSPITAL_COMMUNITY): Payer: Self-pay

## 2021-12-08 ENCOUNTER — Other Ambulatory Visit (HOSPITAL_COMMUNITY): Payer: Self-pay

## 2021-12-11 ENCOUNTER — Other Ambulatory Visit (HOSPITAL_COMMUNITY): Payer: Self-pay

## 2022-01-10 ENCOUNTER — Other Ambulatory Visit (HOSPITAL_COMMUNITY): Payer: Self-pay

## 2022-01-19 ENCOUNTER — Other Ambulatory Visit (HOSPITAL_COMMUNITY): Payer: Self-pay

## 2022-02-13 ENCOUNTER — Other Ambulatory Visit (HOSPITAL_COMMUNITY): Payer: Self-pay

## 2022-02-13 ENCOUNTER — Other Ambulatory Visit: Payer: Self-pay

## 2022-02-13 MED ORDER — AMPHETAMINE-DEXTROAMPHET ER 25 MG PO CP24
25.0000 mg | ORAL_CAPSULE | Freq: Every day | ORAL | 0 refills | Status: AC
  Filled 2022-02-13: qty 30, 30d supply, fill #0

## 2022-02-14 ENCOUNTER — Other Ambulatory Visit (HOSPITAL_COMMUNITY): Payer: Self-pay

## 2022-03-01 ENCOUNTER — Other Ambulatory Visit (HOSPITAL_COMMUNITY): Payer: Self-pay

## 2022-03-01 MED ORDER — PANTOPRAZOLE SODIUM 40 MG PO TBEC
40.0000 mg | DELAYED_RELEASE_TABLET | Freq: Two times a day (BID) | ORAL | 3 refills | Status: AC
  Filled 2022-03-01 – 2022-03-16 (×2): qty 180, 90d supply, fill #0

## 2022-03-01 MED ORDER — AMPHETAMINE-DEXTROAMPHET ER 25 MG PO CP24: 25.0000 mg | ORAL_CAPSULE | Freq: Every day | ORAL | 0 refills | Status: AC

## 2022-03-01 MED ORDER — SUCRALFATE 1 G PO TABS
1.0000 g | ORAL_TABLET | Freq: Every day | ORAL | 4 refills | Status: AC
  Filled 2022-03-01: qty 300, 30d supply, fill #0
  Filled 2022-03-16 – 2022-03-19 (×3): qty 30, 30d supply, fill #0

## 2022-03-01 MED ORDER — AMPHETAMINE-DEXTROAMPHET ER 25 MG PO CP24
25.0000 mg | ORAL_CAPSULE | Freq: Every day | ORAL | 0 refills | Status: AC
  Filled 2022-05-21: qty 30, 30d supply, fill #0

## 2022-03-01 MED ORDER — AMPHETAMINE-DEXTROAMPHET ER 25 MG PO CP24
25.0000 mg | ORAL_CAPSULE | Freq: Every day | ORAL | 0 refills | Status: AC
  Filled 2022-04-19: qty 30, 30d supply, fill #0

## 2022-03-12 IMAGING — MG DIGITAL SCREENING BILAT W/ TOMO W/ CAD
8 series · 8 of 24 positions shown · non-contrast
Comparison: Previous exam(s).

CLINICAL DATA: Screening.

EXAM:
DIGITAL SCREENING BILATERAL MAMMOGRAM WITH TOMO AND CAD

[L CC synth-2D]
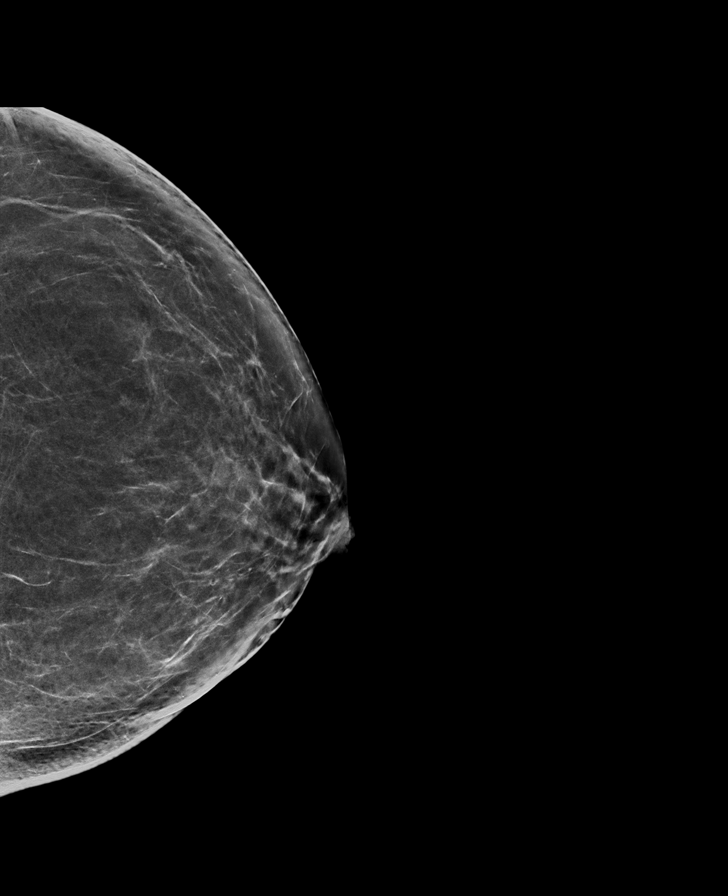

[R MLO synth-2D]
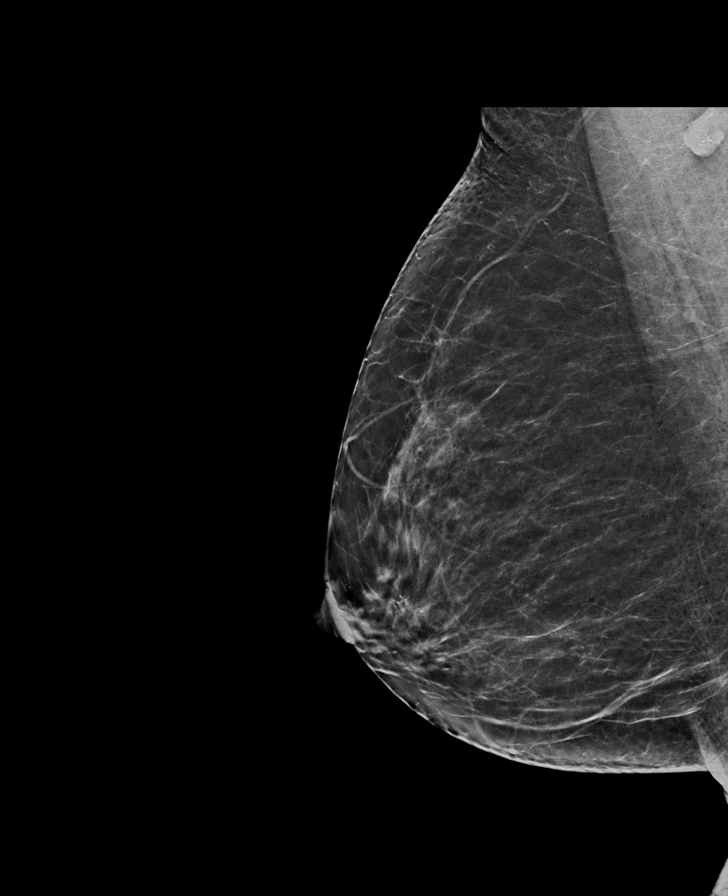

[R CC synth-2D]
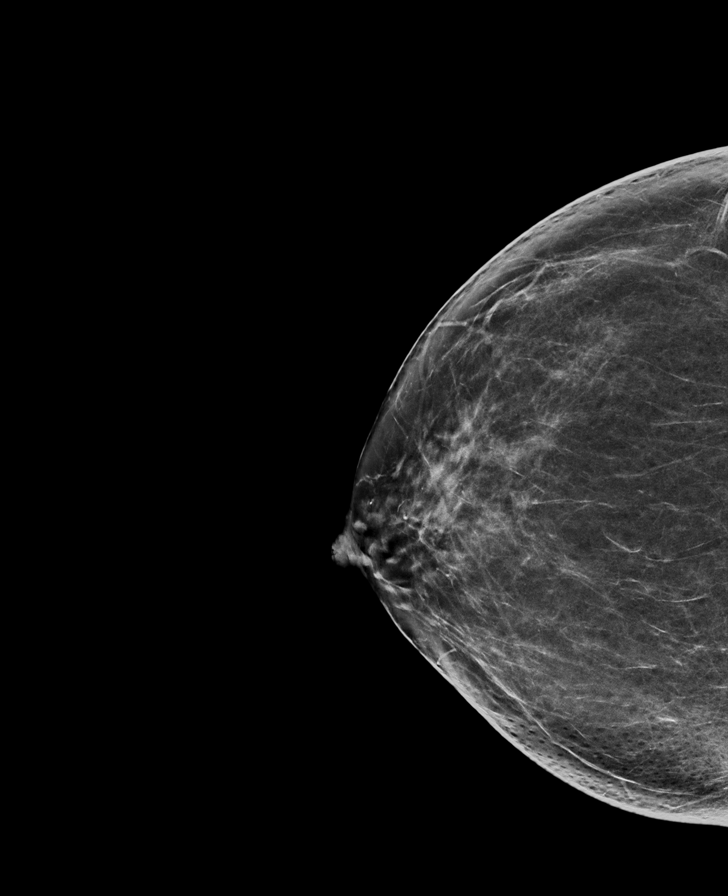

[L MLO synth-2D]
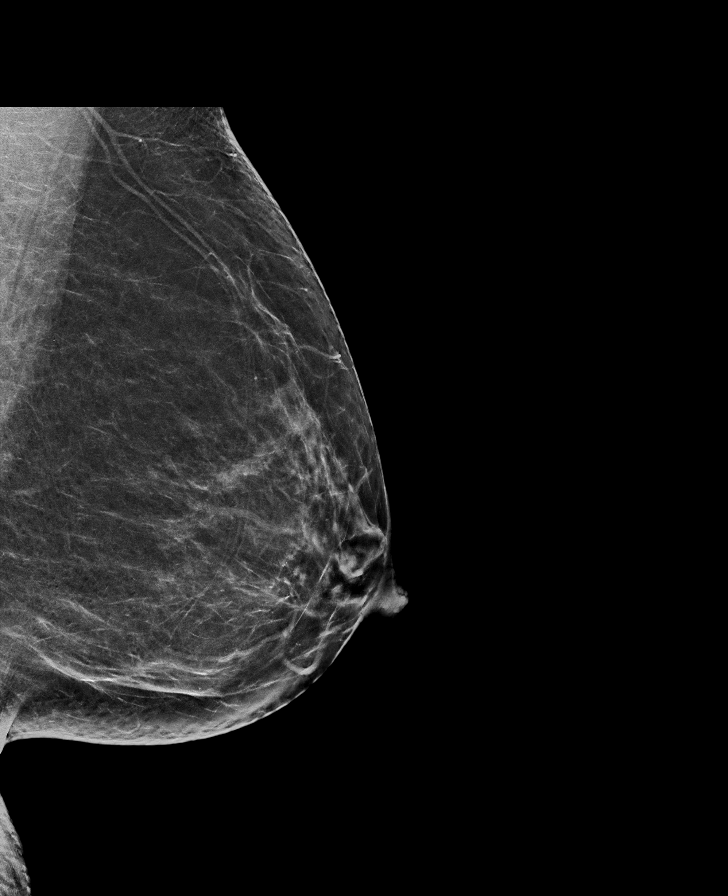

[R CC tomo · tomo slice 35/70.0]
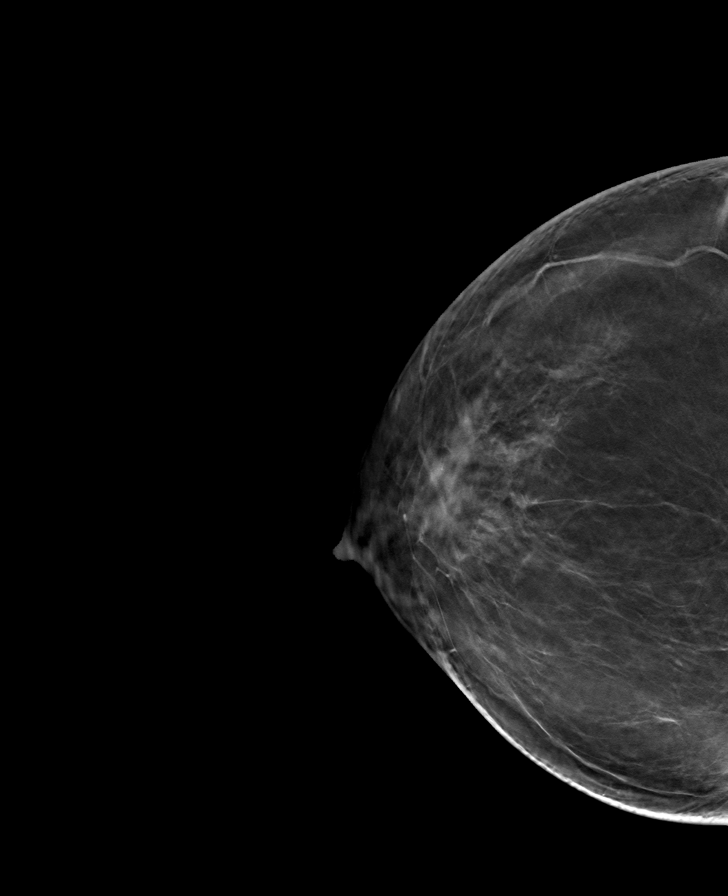

[L MLO tomo · tomo slice 35/70.0]
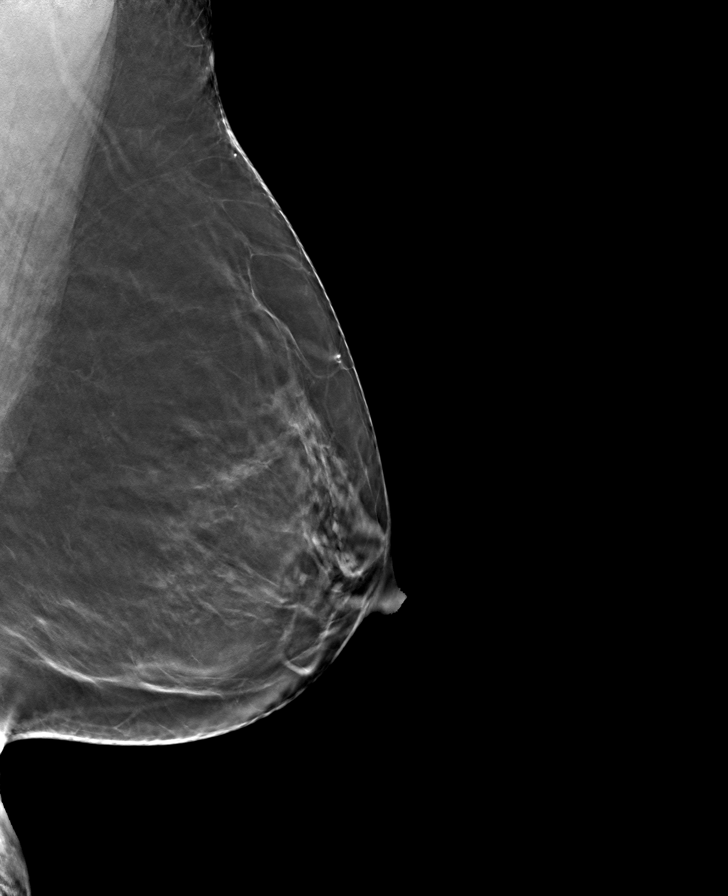

[R MLO tomo · tomo slice 37/72.0]
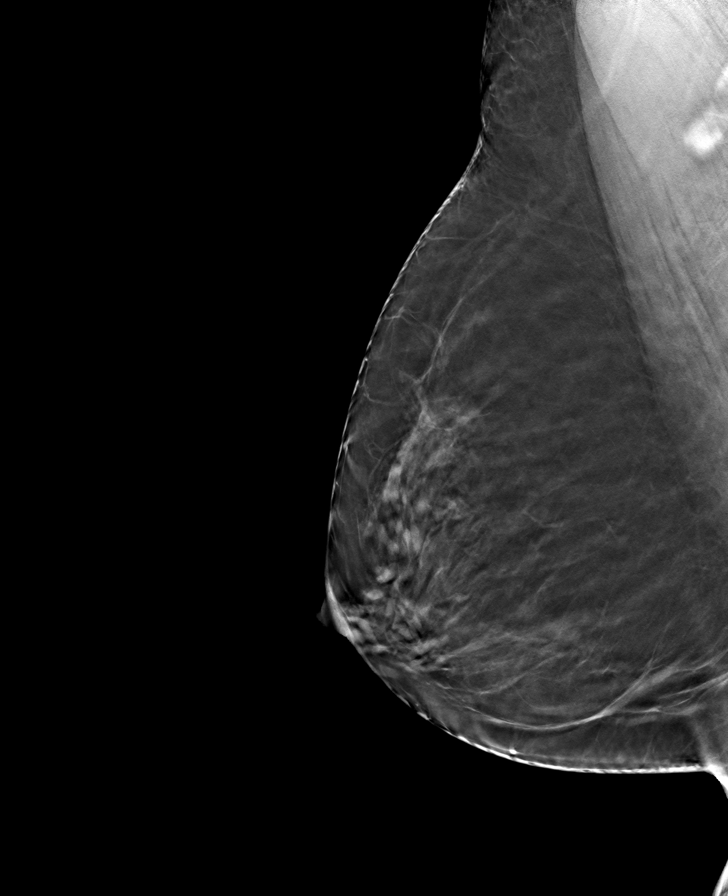

[L CC tomo · tomo slice 37/72.0]
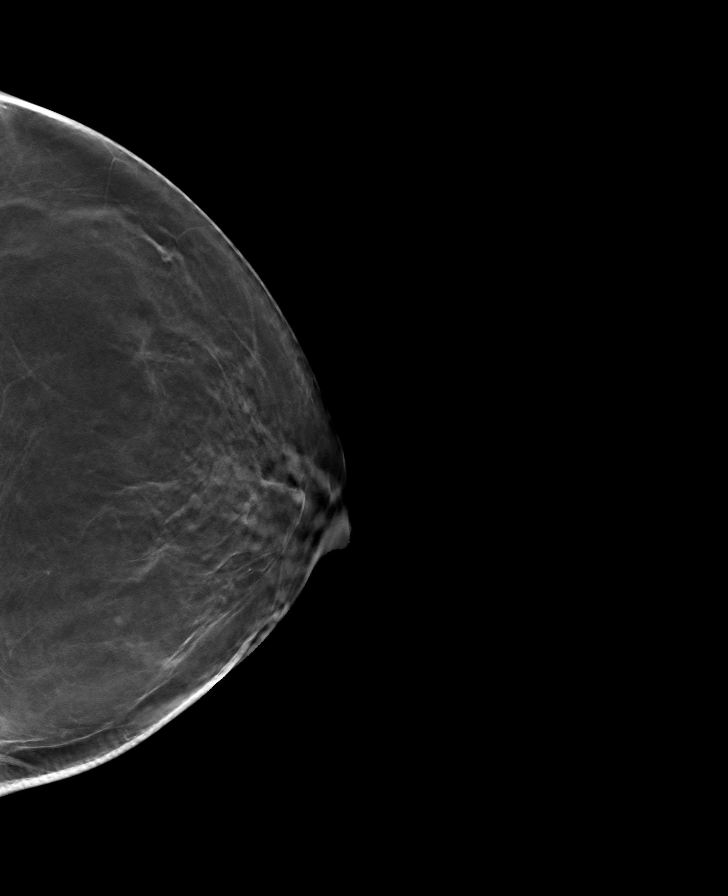

[8 of 24 positions shown; findings below may reference images not displayed]

ACR Breast Density Category b: There are scattered areas of
fibroglandular density.
FINDINGS: In the right breast, a possible mass in the right axilla warrants
further evaluation. In the left breast, no findings suspicious for
malignancy. Images were processed with CAD.
IMPRESSION: Further evaluation is suggested for a possible mass in the right
axilla.

RECOMMENDATION:
Diagnostic mammogram and possibly ultrasound of the right breast.
(Code:[id number])

The patient will be contacted regarding the findings, and additional
imaging will be scheduled.

BI-RADS CATEGORY  0: Incomplete. Need additional imaging evaluation
and/or prior mammograms for comparison.

## 2022-03-15 ENCOUNTER — Other Ambulatory Visit (HOSPITAL_COMMUNITY): Payer: Self-pay

## 2022-03-16 ENCOUNTER — Other Ambulatory Visit (HOSPITAL_COMMUNITY): Payer: Self-pay

## 2022-03-19 ENCOUNTER — Other Ambulatory Visit: Payer: Self-pay

## 2022-03-19 ENCOUNTER — Other Ambulatory Visit (HOSPITAL_COMMUNITY): Payer: Self-pay

## 2022-03-19 MED ORDER — AMOXICILLIN-POT CLAVULANATE 875-125 MG PO TABS
1.0000 | ORAL_TABLET | Freq: Two times a day (BID) | ORAL | 0 refills | Status: AC
  Filled 2022-03-19: qty 14, 7d supply, fill #0

## 2022-03-27 ENCOUNTER — Other Ambulatory Visit (HOSPITAL_COMMUNITY): Payer: Self-pay

## 2022-03-27 MED ORDER — METRONIDAZOLE 250 MG PO TABS
250.0000 mg | ORAL_TABLET | Freq: Three times a day (TID) | ORAL | 0 refills | Status: AC
  Filled 2022-03-27: qty 30, 10d supply, fill #0

## 2022-03-27 MED ORDER — AMOXICILLIN 500 MG PO CAPS
500.0000 mg | ORAL_CAPSULE | Freq: Three times a day (TID) | ORAL | 0 refills | Status: AC
  Filled 2022-03-27: qty 30, 10d supply, fill #0

## 2022-04-09 ENCOUNTER — Other Ambulatory Visit (HOSPITAL_COMMUNITY): Payer: Self-pay

## 2022-04-09 MED ORDER — CHLORPHEN-PE-ACETAMINOPHEN 4-10-325 MG PO TABS
1.0000 | ORAL_TABLET | Freq: Two times a day (BID) | ORAL | 0 refills | Status: AC
  Filled 2022-04-09: qty 20, 10d supply, fill #0

## 2022-04-19 ENCOUNTER — Other Ambulatory Visit (HOSPITAL_COMMUNITY): Payer: Self-pay

## 2022-05-21 ENCOUNTER — Other Ambulatory Visit (HOSPITAL_COMMUNITY): Payer: Self-pay

## 2022-06-26 ENCOUNTER — Other Ambulatory Visit (HOSPITAL_COMMUNITY): Payer: Self-pay

## 2022-06-26 DIAGNOSIS — F988 Other specified behavioral and emotional disorders with onset usually occurring in childhood and adolescence: Secondary | ICD-10-CM | POA: Diagnosis not present

## 2022-06-26 MED ORDER — AMPHETAMINE-DEXTROAMPHET ER 25 MG PO CP24
25.0000 mg | ORAL_CAPSULE | Freq: Every day | ORAL | 0 refills | Status: AC
  Filled 2022-08-28: qty 30, 30d supply, fill #0

## 2022-06-26 MED ORDER — AMPHETAMINE-DEXTROAMPHET ER 25 MG PO CP24
25.0000 mg | ORAL_CAPSULE | Freq: Every day | ORAL | 0 refills | Status: AC
  Filled 2022-06-26: qty 30, 30d supply, fill #0

## 2022-06-26 MED ORDER — AMPHETAMINE-DEXTROAMPHET ER 25 MG PO CP24
25.0000 mg | ORAL_CAPSULE | Freq: Every day | ORAL | 0 refills | Status: AC
  Filled 2022-07-27: qty 30, 30d supply, fill #0

## 2022-07-11 ENCOUNTER — Other Ambulatory Visit (HOSPITAL_COMMUNITY): Payer: Self-pay

## 2022-07-11 MED ORDER — ZEPBOUND 2.5 MG/0.5ML ~~LOC~~ SOAJ
2.5000 mg | SUBCUTANEOUS | 2 refills | Status: AC
  Filled 2022-07-11 – 2022-07-13 (×2): qty 2, 28d supply, fill #0
  Filled 2022-08-03: qty 2, 28d supply, fill #1

## 2022-07-13 ENCOUNTER — Other Ambulatory Visit (HOSPITAL_COMMUNITY): Payer: Self-pay

## 2022-07-27 ENCOUNTER — Other Ambulatory Visit (HOSPITAL_COMMUNITY): Payer: Self-pay

## 2022-07-30 ENCOUNTER — Other Ambulatory Visit (HOSPITAL_COMMUNITY): Payer: Self-pay

## 2022-08-03 ENCOUNTER — Other Ambulatory Visit (HOSPITAL_COMMUNITY): Payer: Self-pay

## 2022-08-08 ENCOUNTER — Other Ambulatory Visit (HOSPITAL_COMMUNITY): Payer: Self-pay

## 2022-08-08 DIAGNOSIS — E663 Overweight: Secondary | ICD-10-CM | POA: Diagnosis not present

## 2022-08-08 DIAGNOSIS — F988 Other specified behavioral and emotional disorders with onset usually occurring in childhood and adolescence: Secondary | ICD-10-CM | POA: Diagnosis not present

## 2022-08-08 MED ORDER — AMPHETAMINE-DEXTROAMPHET ER 25 MG PO CP24
25.0000 mg | ORAL_CAPSULE | Freq: Every day | ORAL | 0 refills | Status: DC
  Filled 2022-09-26: qty 30, 30d supply, fill #0

## 2022-08-08 MED ORDER — ZEPBOUND 5 MG/0.5ML ~~LOC~~ SOAJ
5.0000 mg | SUBCUTANEOUS | 2 refills | Status: AC
  Filled 2022-08-08 – 2022-08-28 (×2): qty 2, 28d supply, fill #0
  Filled 2022-09-26: qty 2, 28d supply, fill #1

## 2022-08-09 ENCOUNTER — Other Ambulatory Visit (HOSPITAL_COMMUNITY): Payer: Self-pay

## 2022-08-21 ENCOUNTER — Other Ambulatory Visit (HOSPITAL_COMMUNITY): Payer: Self-pay

## 2022-08-28 ENCOUNTER — Other Ambulatory Visit (HOSPITAL_COMMUNITY): Payer: Self-pay

## 2022-09-26 ENCOUNTER — Other Ambulatory Visit (HOSPITAL_COMMUNITY): Payer: Self-pay

## 2022-09-27 ENCOUNTER — Other Ambulatory Visit: Payer: Self-pay

## 2022-10-01 ENCOUNTER — Other Ambulatory Visit (HOSPITAL_COMMUNITY): Payer: Self-pay

## 2022-10-01 MED ORDER — METHOCARBAMOL 750 MG PO TABS
750.0000 mg | ORAL_TABLET | Freq: Three times a day (TID) | ORAL | 0 refills | Status: AC | PRN
  Filled 2022-10-01: qty 30, 10d supply, fill #0

## 2022-10-02 ENCOUNTER — Other Ambulatory Visit (HOSPITAL_COMMUNITY): Payer: Self-pay

## 2022-10-02 MED ORDER — ZEPBOUND 5 MG/0.5ML ~~LOC~~ SOAJ
5.0000 mg | SUBCUTANEOUS | 2 refills | Status: AC
  Filled 2022-10-02 – 2022-10-25 (×2): qty 2, 28d supply, fill #0

## 2022-10-02 MED ORDER — CYCLOBENZAPRINE HCL 10 MG PO TABS
10.0000 mg | ORAL_TABLET | Freq: Three times a day (TID) | ORAL | 1 refills | Status: AC
  Filled 2022-10-02: qty 30, 10d supply, fill #0

## 2022-10-02 MED ORDER — METHYLPREDNISOLONE 4 MG PO TBPK
ORAL_TABLET | ORAL | 0 refills | Status: DC
  Filled 2022-10-02: qty 21, 6d supply, fill #0

## 2022-10-02 MED ORDER — OXYCODONE HCL 10 MG PO TABS
10.0000 mg | ORAL_TABLET | Freq: Four times a day (QID) | ORAL | 0 refills | Status: AC
  Filled 2022-10-02: qty 20, 5d supply, fill #0

## 2022-10-25 ENCOUNTER — Other Ambulatory Visit (HOSPITAL_COMMUNITY): Payer: Self-pay

## 2022-10-25 MED ORDER — AMPHETAMINE-DEXTROAMPHET ER 25 MG PO CP24
25.0000 mg | ORAL_CAPSULE | Freq: Every day | ORAL | 0 refills | Status: DC
  Filled 2022-10-25: qty 30, 30d supply, fill #0

## 2022-10-30 ENCOUNTER — Other Ambulatory Visit (HOSPITAL_COMMUNITY): Payer: Self-pay

## 2022-10-30 MED ORDER — ZEPBOUND 7.5 MG/0.5ML ~~LOC~~ SOAJ
7.5000 mg | SUBCUTANEOUS | 4 refills | Status: AC
  Filled 2022-10-30: qty 2, 28d supply, fill #0
  Filled 2022-11-24 (×2): qty 2, 28d supply, fill #1
  Filled 2023-04-09: qty 2, 28d supply, fill #2

## 2022-10-31 ENCOUNTER — Other Ambulatory Visit (HOSPITAL_COMMUNITY): Payer: Self-pay

## 2022-11-24 ENCOUNTER — Other Ambulatory Visit (HOSPITAL_COMMUNITY): Payer: Self-pay

## 2022-11-26 ENCOUNTER — Other Ambulatory Visit (HOSPITAL_COMMUNITY): Payer: Self-pay

## 2022-11-28 DIAGNOSIS — M5416 Radiculopathy, lumbar region: Secondary | ICD-10-CM | POA: Diagnosis not present

## 2022-11-28 DIAGNOSIS — F988 Other specified behavioral and emotional disorders with onset usually occurring in childhood and adolescence: Secondary | ICD-10-CM | POA: Diagnosis not present

## 2022-11-29 ENCOUNTER — Other Ambulatory Visit (HOSPITAL_COMMUNITY): Payer: Self-pay

## 2022-11-29 MED ORDER — METHYLPREDNISOLONE 4 MG PO TBPK
ORAL_TABLET | ORAL | 0 refills | Status: AC
  Filled 2022-11-29: qty 21, 6d supply, fill #0

## 2022-11-29 MED ORDER — AMPHETAMINE-DEXTROAMPHET ER 25 MG PO CP24
25.0000 mg | ORAL_CAPSULE | Freq: Every day | ORAL | 0 refills | Status: DC
Start: 2022-11-28 — End: 2022-12-20
  Filled 2022-11-29: qty 30, 30d supply, fill #0

## 2022-11-29 MED ORDER — ZEPBOUND 7.5 MG/0.5ML ~~LOC~~ SOAJ
7.5000 mg | SUBCUTANEOUS | 4 refills | Status: AC
  Filled 2022-11-29 – 2022-12-19 (×2): qty 2, 28d supply, fill #0
  Filled 2023-01-24: qty 2, 28d supply, fill #1

## 2022-12-06 DIAGNOSIS — M5116 Intervertebral disc disorders with radiculopathy, lumbar region: Secondary | ICD-10-CM | POA: Diagnosis not present

## 2022-12-06 DIAGNOSIS — M5117 Intervertebral disc disorders with radiculopathy, lumbosacral region: Secondary | ICD-10-CM | POA: Diagnosis not present

## 2022-12-06 DIAGNOSIS — M5416 Radiculopathy, lumbar region: Secondary | ICD-10-CM | POA: Diagnosis not present

## 2022-12-06 DIAGNOSIS — M4726 Other spondylosis with radiculopathy, lumbar region: Secondary | ICD-10-CM | POA: Diagnosis not present

## 2022-12-06 DIAGNOSIS — M48061 Spinal stenosis, lumbar region without neurogenic claudication: Secondary | ICD-10-CM | POA: Diagnosis not present

## 2022-12-19 ENCOUNTER — Other Ambulatory Visit (HOSPITAL_COMMUNITY): Payer: Self-pay

## 2022-12-19 ENCOUNTER — Other Ambulatory Visit: Payer: Self-pay

## 2022-12-20 ENCOUNTER — Other Ambulatory Visit (HOSPITAL_COMMUNITY): Payer: Self-pay

## 2022-12-20 MED ORDER — AMPHETAMINE-DEXTROAMPHET ER 25 MG PO CP24
25.0000 mg | ORAL_CAPSULE | Freq: Every day | ORAL | 0 refills | Status: DC
  Filled 2022-12-20 – 2023-01-24 (×2): qty 30, 30d supply, fill #0

## 2022-12-21 ENCOUNTER — Other Ambulatory Visit (HOSPITAL_COMMUNITY): Payer: Self-pay

## 2022-12-21 DIAGNOSIS — M5417 Radiculopathy, lumbosacral region: Secondary | ICD-10-CM | POA: Diagnosis not present

## 2022-12-21 DIAGNOSIS — M5127 Other intervertebral disc displacement, lumbosacral region: Secondary | ICD-10-CM | POA: Diagnosis not present

## 2022-12-21 DIAGNOSIS — Z6826 Body mass index (BMI) 26.0-26.9, adult: Secondary | ICD-10-CM | POA: Diagnosis not present

## 2022-12-26 ENCOUNTER — Other Ambulatory Visit (HOSPITAL_COMMUNITY): Payer: Self-pay

## 2022-12-26 DIAGNOSIS — S39012A Strain of muscle, fascia and tendon of lower back, initial encounter: Secondary | ICD-10-CM | POA: Diagnosis not present

## 2022-12-26 DIAGNOSIS — F988 Other specified behavioral and emotional disorders with onset usually occurring in childhood and adolescence: Secondary | ICD-10-CM | POA: Diagnosis not present

## 2022-12-26 DIAGNOSIS — M5416 Radiculopathy, lumbar region: Secondary | ICD-10-CM | POA: Diagnosis not present

## 2022-12-26 MED ORDER — AMPHETAMINE-DEXTROAMPHET ER 25 MG PO CP24
25.0000 mg | ORAL_CAPSULE | Freq: Every day | ORAL | 0 refills | Status: AC
Start: 2022-12-26 — End: ?
  Filled 2022-12-26: qty 30, 30d supply, fill #0

## 2022-12-27 ENCOUNTER — Other Ambulatory Visit: Payer: Self-pay

## 2023-01-07 ENCOUNTER — Other Ambulatory Visit (HOSPITAL_COMMUNITY): Payer: Self-pay

## 2023-01-07 DIAGNOSIS — M51A4 Intervertebral annulus fibrosus defect, small, lumbosacral region: Secondary | ICD-10-CM | POA: Diagnosis not present

## 2023-01-07 DIAGNOSIS — M5127 Other intervertebral disc displacement, lumbosacral region: Secondary | ICD-10-CM | POA: Diagnosis not present

## 2023-01-07 DIAGNOSIS — M5117 Intervertebral disc disorders with radiculopathy, lumbosacral region: Secondary | ICD-10-CM | POA: Diagnosis not present

## 2023-01-07 MED ORDER — OXYCODONE-ACETAMINOPHEN 5-325 MG PO TABS
1.0000 | ORAL_TABLET | ORAL | 0 refills | Status: DC | PRN
  Filled 2023-01-07: qty 30, 5d supply, fill #0

## 2023-01-07 MED ORDER — CYCLOBENZAPRINE HCL 10 MG PO TABS
10.0000 mg | ORAL_TABLET | Freq: Three times a day (TID) | ORAL | 0 refills | Status: DC | PRN
  Filled 2023-01-07: qty 30, 10d supply, fill #0

## 2023-01-24 ENCOUNTER — Other Ambulatory Visit (HOSPITAL_BASED_OUTPATIENT_CLINIC_OR_DEPARTMENT_OTHER): Payer: Self-pay

## 2023-01-24 ENCOUNTER — Other Ambulatory Visit (HOSPITAL_COMMUNITY): Payer: Self-pay

## 2023-02-21 ENCOUNTER — Other Ambulatory Visit: Payer: Self-pay

## 2023-02-21 ENCOUNTER — Other Ambulatory Visit (HOSPITAL_COMMUNITY): Payer: Self-pay

## 2023-02-21 MED ORDER — AMPHETAMINE-DEXTROAMPHET ER 25 MG PO CP24
25.0000 mg | ORAL_CAPSULE | Freq: Every day | ORAL | 0 refills | Status: AC
  Filled 2023-05-17: qty 30, 30d supply, fill #0

## 2023-02-21 MED ORDER — ZEPBOUND 7.5 MG/0.5ML ~~LOC~~ SOAJ
SUBCUTANEOUS | 4 refills | Status: AC
  Filled 2023-02-21: qty 2, 28d supply, fill #0
  Filled 2023-05-17: qty 2, 28d supply, fill #1
  Filled 2023-06-14: qty 2, 28d supply, fill #2
  Filled 2023-08-19: qty 2, 28d supply, fill #3
  Filled 2023-09-27: qty 2, 28d supply, fill #4

## 2023-02-21 MED ORDER — AMPHETAMINE-DEXTROAMPHET ER 25 MG PO CP24
25.0000 mg | ORAL_CAPSULE | Freq: Every day | ORAL | 0 refills | Status: AC
  Filled 2023-02-21: qty 30, 30d supply, fill #0

## 2023-02-21 MED ORDER — AMPHETAMINE-DEXTROAMPHET ER 25 MG PO CP24
25.0000 mg | ORAL_CAPSULE | Freq: Every day | ORAL | 0 refills | Status: AC
  Filled 2023-04-09: qty 30, 30d supply, fill #0

## 2023-02-28 ENCOUNTER — Other Ambulatory Visit (HOSPITAL_COMMUNITY): Payer: Self-pay

## 2023-03-08 ENCOUNTER — Other Ambulatory Visit (HOSPITAL_COMMUNITY): Payer: Self-pay

## 2023-03-08 MED ORDER — OSELTAMIVIR PHOSPHATE 75 MG PO CAPS
75.0000 mg | ORAL_CAPSULE | Freq: Two times a day (BID) | ORAL | 0 refills | Status: AC
  Filled 2023-03-08: qty 10, 5d supply, fill #0

## 2023-03-08 MED ORDER — PREDNISONE 10 MG (21) PO TBPK
ORAL_TABLET | ORAL | 0 refills | Status: AC
  Filled 2023-03-08: qty 21, 6d supply, fill #0

## 2023-03-08 MED ORDER — BENZONATATE 200 MG PO CAPS
200.0000 mg | ORAL_CAPSULE | Freq: Three times a day (TID) | ORAL | 0 refills | Status: AC | PRN
  Filled 2023-03-08: qty 30, 10d supply, fill #0

## 2023-04-09 ENCOUNTER — Other Ambulatory Visit (HOSPITAL_COMMUNITY): Payer: Self-pay

## 2023-05-17 ENCOUNTER — Other Ambulatory Visit (HOSPITAL_BASED_OUTPATIENT_CLINIC_OR_DEPARTMENT_OTHER): Payer: Self-pay

## 2023-05-17 ENCOUNTER — Other Ambulatory Visit (HOSPITAL_COMMUNITY): Payer: Self-pay

## 2023-05-17 MED ORDER — AMPHETAMINE-DEXTROAMPHET ER 25 MG PO CP24
25.0000 mg | ORAL_CAPSULE | Freq: Every day | ORAL | 0 refills | Status: AC
  Filled 2023-06-14: qty 30, 30d supply, fill #0

## 2023-05-17 MED ORDER — CIPROFLOXACIN HCL 500 MG PO TABS
500.0000 mg | ORAL_TABLET | Freq: Two times a day (BID) | ORAL | 0 refills | Status: AC
  Filled 2023-05-17: qty 14, 7d supply, fill #0

## 2023-05-17 MED ORDER — ZEPBOUND 7.5 MG/0.5ML ~~LOC~~ SOAJ: 7.5000 mg | SUBCUTANEOUS | 4 refills | Status: AC

## 2023-06-14 ENCOUNTER — Other Ambulatory Visit: Payer: Self-pay

## 2023-06-14 ENCOUNTER — Other Ambulatory Visit (HOSPITAL_COMMUNITY): Payer: Self-pay

## 2023-07-12 ENCOUNTER — Other Ambulatory Visit (HOSPITAL_COMMUNITY): Payer: Self-pay

## 2023-07-12 DIAGNOSIS — F988 Other specified behavioral and emotional disorders with onset usually occurring in childhood and adolescence: Secondary | ICD-10-CM | POA: Diagnosis not present

## 2023-07-12 DIAGNOSIS — E663 Overweight: Secondary | ICD-10-CM | POA: Diagnosis not present

## 2023-07-12 MED ORDER — AMPHETAMINE-DEXTROAMPHET ER 25 MG PO CP24: 25.0000 mg | ORAL_CAPSULE | Freq: Every day | ORAL | 0 refills | Status: AC

## 2023-07-12 MED ORDER — AMPHETAMINE-DEXTROAMPHET ER 25 MG PO CP24
25.0000 mg | ORAL_CAPSULE | Freq: Every day | ORAL | 0 refills | Status: AC
  Filled 2023-07-12: qty 16, 16d supply, fill #0
  Filled 2023-07-12: qty 14, 14d supply, fill #0

## 2023-07-12 MED ORDER — AMPHETAMINE-DEXTROAMPHET ER 25 MG PO CP24
25.0000 mg | ORAL_CAPSULE | Freq: Every day | ORAL | 0 refills | Status: AC
  Filled 2023-08-19: qty 30, 30d supply, fill #0

## 2023-07-12 MED ORDER — ZEPBOUND 7.5 MG/0.5ML ~~LOC~~ SOAJ
7.5000 mg | SUBCUTANEOUS | 4 refills | Status: AC
  Filled 2023-07-12: qty 2, 28d supply, fill #0

## 2023-07-15 ENCOUNTER — Other Ambulatory Visit (HOSPITAL_COMMUNITY): Payer: Self-pay

## 2023-08-19 ENCOUNTER — Other Ambulatory Visit (HOSPITAL_COMMUNITY): Payer: Self-pay

## 2023-08-19 ENCOUNTER — Other Ambulatory Visit: Payer: Self-pay

## 2023-09-03 ENCOUNTER — Other Ambulatory Visit (HOSPITAL_COMMUNITY): Payer: Self-pay

## 2023-09-03 DIAGNOSIS — F988 Other specified behavioral and emotional disorders with onset usually occurring in childhood and adolescence: Secondary | ICD-10-CM | POA: Diagnosis not present

## 2023-09-03 DIAGNOSIS — W57XXXA Bitten or stung by nonvenomous insect and other nonvenomous arthropods, initial encounter: Secondary | ICD-10-CM | POA: Diagnosis not present

## 2023-09-03 MED ORDER — AMPHETAMINE-DEXTROAMPHET ER 25 MG PO CP24
25.0000 mg | ORAL_CAPSULE | Freq: Every day | ORAL | 0 refills | Status: AC
  Filled 2023-09-03 – 2023-09-27 (×2): qty 30, 30d supply, fill #0

## 2023-09-03 MED ORDER — AMPHETAMINE-DEXTROAMPHET ER 25 MG PO CP24
25.0000 mg | ORAL_CAPSULE | Freq: Every day | ORAL | 0 refills | Status: AC
  Filled 2023-11-29: qty 30, 30d supply, fill #0

## 2023-09-03 MED ORDER — DOXYCYCLINE HYCLATE 100 MG PO CAPS
100.0000 mg | ORAL_CAPSULE | Freq: Two times a day (BID) | ORAL | 0 refills | Status: AC
  Filled 2023-09-03: qty 20, 10d supply, fill #0

## 2023-09-03 MED ORDER — AMPHETAMINE-DEXTROAMPHET ER 25 MG PO CP24
25.0000 mg | ORAL_CAPSULE | Freq: Every day | ORAL | 0 refills | Status: AC
  Filled 2023-10-26: qty 30, 30d supply, fill #0

## 2023-09-03 MED ORDER — ZEPBOUND 7.5 MG/0.5ML ~~LOC~~ SOAJ
7.5000 mg | SUBCUTANEOUS | 4 refills | Status: AC
  Filled 2023-09-03: qty 4, 56d supply, fill #0

## 2023-09-04 ENCOUNTER — Other Ambulatory Visit (HOSPITAL_COMMUNITY): Payer: Self-pay

## 2023-09-27 ENCOUNTER — Other Ambulatory Visit (HOSPITAL_COMMUNITY): Payer: Self-pay

## 2023-10-01 DIAGNOSIS — Z01812 Encounter for preprocedural laboratory examination: Secondary | ICD-10-CM | POA: Diagnosis not present

## 2023-10-08 DIAGNOSIS — Z01812 Encounter for preprocedural laboratory examination: Secondary | ICD-10-CM | POA: Diagnosis not present

## 2023-10-22 ENCOUNTER — Other Ambulatory Visit (HOSPITAL_COMMUNITY): Payer: Self-pay

## 2023-10-22 MED ORDER — XARELTO 10 MG PO TABS
10.0000 mg | ORAL_TABLET | Freq: Every day | ORAL | 0 refills | Status: AC
  Filled 2023-10-22: qty 7, 7d supply, fill #0

## 2023-10-22 MED ORDER — OXYCODONE-ACETAMINOPHEN 5-325 MG PO TABS
1.0000 | ORAL_TABLET | Freq: Four times a day (QID) | ORAL | 0 refills | Status: AC | PRN
  Filled 2023-10-22: qty 28, 7d supply, fill #0

## 2023-10-22 MED ORDER — ASPIRIN 81 MG PO TBEC
DELAYED_RELEASE_TABLET | ORAL | 0 refills | Status: AC
  Filled 2023-10-22: qty 21, 21d supply, fill #0

## 2023-10-22 MED ORDER — CEPHALEXIN 500 MG PO CAPS
500.0000 mg | ORAL_CAPSULE | Freq: Three times a day (TID) | ORAL | 0 refills | Status: AC
  Filled 2023-10-22: qty 42, 14d supply, fill #0

## 2023-10-22 MED ORDER — ONDANSETRON 4 MG PO TBDP
4.0000 mg | ORAL_TABLET | Freq: Four times a day (QID) | ORAL | 0 refills | Status: AC
  Filled 2023-10-22: qty 20, 5d supply, fill #0

## 2023-10-22 MED ORDER — SCOPOLAMINE 1 MG/3DAYS TD PT72
1.0000 | MEDICATED_PATCH | TRANSDERMAL | 0 refills | Status: AC
  Filled 2023-10-22: qty 1, 3d supply, fill #0

## 2023-10-22 MED ORDER — DIAZEPAM 5 MG PO TABS
5.0000 mg | ORAL_TABLET | Freq: Three times a day (TID) | ORAL | 0 refills | Status: AC | PRN
  Filled 2023-10-22: qty 20, 6d supply, fill #0

## 2023-10-22 MED ORDER — MUPIROCIN 2 % EX OINT
1.0000 | TOPICAL_OINTMENT | Freq: Three times a day (TID) | CUTANEOUS | 0 refills | Status: DC
  Filled 2023-10-22 (×2): qty 22, 8d supply, fill #0

## 2023-10-26 ENCOUNTER — Other Ambulatory Visit (HOSPITAL_COMMUNITY): Payer: Self-pay

## 2023-11-05 ENCOUNTER — Other Ambulatory Visit (HOSPITAL_COMMUNITY): Payer: Self-pay

## 2023-11-05 MED ORDER — AMOXICILLIN-POT CLAVULANATE 500-125 MG PO TABS
1.0000 | ORAL_TABLET | Freq: Two times a day (BID) | ORAL | 0 refills | Status: AC
  Filled 2023-11-05: qty 14, 7d supply, fill #0

## 2023-11-08 ENCOUNTER — Other Ambulatory Visit (HOSPITAL_COMMUNITY): Payer: Self-pay

## 2023-11-18 ENCOUNTER — Other Ambulatory Visit (HOSPITAL_COMMUNITY): Payer: Self-pay

## 2023-11-18 MED ORDER — MUPIROCIN 2 % EX OINT
1.0000 | TOPICAL_OINTMENT | Freq: Two times a day (BID) | CUTANEOUS | 0 refills | Status: AC
  Filled 2023-11-18 (×2): qty 22, 11d supply, fill #0

## 2023-11-18 MED ORDER — SULFAMETHOXAZOLE-TRIMETHOPRIM 800-160 MG PO TABS
1.0000 | ORAL_TABLET | Freq: Every day | ORAL | 0 refills | Status: DC
  Filled 2023-11-18: qty 7, 7d supply, fill #0

## 2023-11-22 ENCOUNTER — Other Ambulatory Visit (HOSPITAL_COMMUNITY): Payer: Self-pay

## 2023-11-22 MED ORDER — SULFAMETHOXAZOLE-TRIMETHOPRIM 800-160 MG PO TABS
1.0000 | ORAL_TABLET | Freq: Every day | ORAL | 0 refills | Status: AC
  Filled 2023-11-22 – 2023-11-23 (×3): qty 7, 7d supply, fill #0

## 2023-11-23 ENCOUNTER — Other Ambulatory Visit (HOSPITAL_COMMUNITY): Payer: Self-pay

## 2023-11-29 ENCOUNTER — Other Ambulatory Visit (HOSPITAL_COMMUNITY): Payer: Self-pay

## 2023-12-12 ENCOUNTER — Other Ambulatory Visit (HOSPITAL_COMMUNITY): Payer: Self-pay

## 2023-12-12 DIAGNOSIS — F900 Attention-deficit hyperactivity disorder, predominantly inattentive type: Secondary | ICD-10-CM | POA: Diagnosis not present

## 2023-12-12 DIAGNOSIS — K219 Gastro-esophageal reflux disease without esophagitis: Secondary | ICD-10-CM | POA: Diagnosis not present

## 2023-12-12 MED ORDER — PANTOPRAZOLE SODIUM 20 MG PO TBEC
20.0000 mg | DELAYED_RELEASE_TABLET | Freq: Two times a day (BID) | ORAL | 1 refills | Status: AC | PRN
  Filled 2023-12-12: qty 180, 90d supply, fill #0

## 2023-12-12 MED ORDER — AMPHETAMINE-DEXTROAMPHET ER 25 MG PO CP24
25.0000 mg | ORAL_CAPSULE | Freq: Every morning | ORAL | 0 refills | Status: AC
  Filled 2023-12-12: qty 90, 90d supply, fill #0

## 2023-12-13 ENCOUNTER — Other Ambulatory Visit (HOSPITAL_COMMUNITY): Payer: Self-pay

## 2024-01-17 ENCOUNTER — Other Ambulatory Visit (HOSPITAL_COMMUNITY): Payer: Self-pay

## 2024-01-17 MED ORDER — UPNEEQ 0.1 % OP SOLN: 1.0000 [drp] | OPHTHALMIC | 0 refills | Status: AC | PRN

## 2024-03-12 ENCOUNTER — Other Ambulatory Visit (HOSPITAL_COMMUNITY): Payer: Self-pay

## 2024-03-12 MED ORDER — ZEPBOUND 2.5 MG/0.5ML ~~LOC~~ SOAJ
2.5000 mg | SUBCUTANEOUS | 3 refills | Status: AC
  Filled 2024-03-12 (×2): qty 2, 28d supply, fill #0

## 2024-03-12 MED ORDER — AMPHETAMINE-DEXTROAMPHET ER 25 MG PO CP24
25.0000 mg | ORAL_CAPSULE | Freq: Every morning | ORAL | 0 refills | Status: AC
  Filled 2024-03-12: qty 90, 90d supply, fill #0
